# Patient Record
Sex: Female | Born: 1960
Health system: Southern US, Community
[De-identification: ages and names within clinical notes are randomized; demographics above are authoritative.]

## PROBLEM LIST (undated history)

## (undated) DIAGNOSIS — E785 Hyperlipidemia, unspecified: Secondary | ICD-10-CM

## (undated) DIAGNOSIS — I10 Essential (primary) hypertension: Secondary | ICD-10-CM

## (undated) DIAGNOSIS — G43909 Migraine, unspecified, not intractable, without status migrainosus: Secondary | ICD-10-CM

## (undated) DIAGNOSIS — M549 Dorsalgia, unspecified: Secondary | ICD-10-CM

## (undated) DIAGNOSIS — I97190 Other postprocedural cardiac functional disturbances following cardiac surgery: Secondary | ICD-10-CM

## (undated) HISTORY — DX: Hyperlipidemia, unspecified: E78.5

## (undated) HISTORY — DX: Migraine, unspecified, not intractable, without status migrainosus: G43.909

## (undated) HISTORY — DX: Dorsalgia, unspecified: M54.9

## (undated) HISTORY — DX: Other postprocedural cardiac functional disturbances following cardiac surgery: I97.190

## (undated) HISTORY — DX: Essential (primary) hypertension: I10

## (undated) HISTORY — PX: ADENOIDECTOMY: SUR15

---
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
## 1997-12-23 HISTORY — PX: BREAST BIOPSY: SHX20

## 2003-05-26 HISTORY — PX: CHOLECYSTECTOMY: SHX55

## 2007-08-26 HISTORY — PX: BICEPS TENDON REPAIR: SHX566

## 2012-03-08 LAB — HM COLONOSCOPY

## 2013-10-18 ENCOUNTER — Ambulatory Visit: Payer: Managed Care, Other (non HMO) | Admitting: Internal Medicine

## 2013-10-18 DIAGNOSIS — Z0289 Encounter for other administrative examinations: Secondary | ICD-10-CM

## 2013-11-15 ENCOUNTER — Ambulatory Visit (INDEPENDENT_AMBULATORY_CARE_PROVIDER_SITE_OTHER): Payer: Managed Care, Other (non HMO) | Admitting: Family Medicine

## 2013-11-15 ENCOUNTER — Encounter: Payer: Self-pay | Admitting: Family Medicine

## 2013-11-15 VITALS — BP 125/73 | HR 81 | Ht 64.0 in | Wt 151.0 lb

## 2013-11-15 DIAGNOSIS — I498 Other specified cardiac arrhythmias: Secondary | ICD-10-CM

## 2013-11-15 DIAGNOSIS — Z299 Encounter for prophylactic measures, unspecified: Secondary | ICD-10-CM

## 2013-11-15 DIAGNOSIS — H9190 Unspecified hearing loss, unspecified ear: Secondary | ICD-10-CM

## 2013-11-15 DIAGNOSIS — G43909 Migraine, unspecified, not intractable, without status migrainosus: Secondary | ICD-10-CM

## 2013-11-15 DIAGNOSIS — Z79899 Other long term (current) drug therapy: Secondary | ICD-10-CM

## 2013-11-15 DIAGNOSIS — Z5181 Encounter for therapeutic drug level monitoring: Secondary | ICD-10-CM

## 2013-11-15 DIAGNOSIS — I10 Essential (primary) hypertension: Secondary | ICD-10-CM

## 2013-11-15 DIAGNOSIS — R6889 Other general symptoms and signs: Secondary | ICD-10-CM | POA: Insufficient documentation

## 2013-11-15 DIAGNOSIS — E785 Hyperlipidemia, unspecified: Secondary | ICD-10-CM

## 2013-11-15 LAB — T4, FREE: Free T4: 1.19 ng/dL (ref 0.80–1.80)

## 2013-11-15 LAB — COMPLETE METABOLIC PANEL WITH GFR
ALK PHOS: 121 U/L — AB (ref 39–117)
ALT: 13 U/L (ref 0–35)
AST: 14 U/L (ref 0–37)
Albumin: 4.2 g/dL (ref 3.5–5.2)
BUN: 18 mg/dL (ref 6–23)
CALCIUM: 10 mg/dL (ref 8.4–10.5)
CO2: 27 mEq/L (ref 19–32)
CREATININE: 0.63 mg/dL (ref 0.50–1.10)
Chloride: 104 mEq/L (ref 96–112)
GFR, Est African American: >89 mL/min
GFR, Est Non African American: >89 mL/min
Glucose, Bld: 92 mg/dL (ref 70–99)
Potassium: 4.5 mEq/L (ref 3.5–5.3)
Sodium: 141 mEq/L (ref 135–145)
Total Bilirubin: 0.8 mg/dL (ref 0.2–1.2)
Total Protein: 6.7 g/dL (ref 6.0–8.3)

## 2013-11-15 LAB — LIPID PANEL
CHOL/HDL RATIO: 3.3 ratio
CHOLESTEROL: 175 mg/dL (ref 0–200)
HDL: 53 mg/dL (ref >39)
LDL Cholesterol: 102 mg/dL — ABNORMAL HIGH (ref 0–99)
TRIGLYCERIDES: 100 mg/dL (ref <150)
VLDL: 20 mg/dL (ref 0–40)

## 2013-11-15 LAB — TSH: TSH: 1.146 u[IU]/mL (ref 0.350–4.500)

## 2013-11-15 LAB — T3, FREE: T3 FREE: 3.4 pg/mL (ref 2.3–4.2)

## 2013-11-15 MED ORDER — AMLODIPINE BESY-BENAZEPRIL HCL 5-10 MG PO CAPS: 1.0000 | ORAL_CAPSULE | Freq: Every day | ORAL | Status: DC

## 2013-11-15 MED ORDER — METOPROLOL SUCCINATE ER 50 MG PO TB24: 50.0000 mg | ORAL_TABLET | Freq: Every day | ORAL | Status: DC

## 2013-11-15 MED ORDER — PRAVASTATIN SODIUM 10 MG PO TABS: 10.0000 mg | ORAL_TABLET | Freq: Every day | ORAL | Status: DC

## 2013-11-15 MED ORDER — GABAPENTIN 300 MG PO CAPS: 300.0000 mg | ORAL_CAPSULE | Freq: Three times a day (TID) | ORAL | Status: DC

## 2013-11-15 NOTE — Progress Notes (Signed)
CC: Rebecca Berry is a 53 y.o. female is here for Establish Care   Subjective: HPI:   Complains of fatigue and unintentional weight gain has been present for the past year she estimates 30 pounds of weight gain despite exercising most days of the week and cutting out simple sugars in her diet. This is not accompanied by a bloating sensation in her abdomen that is diffuse mild in severity nothing particularly makes better or worse. No interventions as of yet for the above symptoms. They're present on a daily basis.  Weight gain has been gradual.  Complains of chronic hearing loss due to what she describes as stapes deterioration that began years ago. Hearing loss does not interfere with quality of life has not been subjectively declining over the past 2 years since she had an audiology evaluation last. When she last saw the nares and throat they recommended audiology evaluation every 2 years.   She reports a history of intermittent rectal bleeding but has been present ever since the birth of her first child. She colonoscopy in 2013 for these symptoms which was unremarkable other than hemorrhoids. Bleeding seems to be worse during periods of constipation when she's missed a day of defecating or when she strains to defecate. Denies melena  Reports a history of hypertension unknown date at original diagnosis but has been taking metoprolol and Lotrel daily without missed doses. She has a blood pressure diary today spanning back about a month all blood pressures are well within the normotensive range. She was no she can stop any of her medications.  Reports a history of migraines that she currently takes gabapentin for on a daily basis. She started this about a year ago and ever since then has somewhere around 1-2 migraines a year that are resolved with Imitrex as needed.  Reports a history of hyperlipidemia currently taking pravastatin on a daily basis it's been a year since her last cholesterol  panel was checked. Denies right upper quadrant pain nor myalgias  Review of Systems - General ROS: negative for - chills, fever, night sweats, or weight loss Ophthalmic ROS: negative for - decreased vision Psychological ROS: negative for - anxiety or depression ENT ROS: negative for - hearing change, nasal congestion, tinnitus or allergies Hematological and Lymphatic ROS: negative for - bleeding problems, bruising or swollen lymph nodes Breast ROS: negative Respiratory ROS: no cough, shortness of breath, or wheezing Cardiovascular ROS: no chest pain or dyspnea on exertion Gastrointestinal ROS: no abdominal pain, change in bowel habits, or black stools Genito-Urinary ROS: negative for - genital discharge, genital ulcers, incontinence or abnormal bleeding from genitals Musculoskeletal ROS: negative for - joint pain or muscle pain Neurological ROS: negative for - headaches or memory loss Dermatological ROS: negative for lumps, mole changes, rash and skin lesion changes  Past Medical History  Diagnosis Date  . Pacemaker syndrome   . Hypertension   . Hyperlipidemia     Past Surgical History  Procedure Laterality Date  . Tonsillectomy and adenoidectomy  1962  . Breast biopsy  12/1997  . Cholecystectomy  05/2003  . Biceps tendon repair  08/2007   Family History  Problem Relation Age of Onset  . Alcoholism Father   . Bladder Cancer Father   . Lung cancer Mother   . Heart attack Father   . Hypertension Father   . Leukemia Paternal Grandmother     History   Social History  . Marital Status: Married    Spouse Name: N/A  Number of Children: N/A  . Years of Education: N/A   Occupational History  . Not on file.   Social History Main Topics  . Smoking status: Former Smoker    Quit date: 03/26/2012  . Smokeless tobacco: Not on file  . Alcohol Use: No  . Drug Use: No  . Sexual Activity: Yes    Partners: Male   Other Topics Concern  . Not on file   Social History Narrative   . No narrative on file     Objective: BP 125/73  Pulse 81  Ht 5\' 4"  (1.626 m)  Wt 151 lb (68.493 kg)  BMI 25.91 kg/m2  General: Alert and Oriented, No Acute Distress HEENT: Pupils equal, round, reactive to light. Conjunctivae clear.  External ears unremarkable, canals clear with intact TMs with appropriate landmarks.  Middle ear appears open without effusion. Pink inferior turbinates.  Moist mucous membranes, pharynx without inflammation nor lesions.  Neck supple without palpable lymphadenopathy nor abnormal masses. Lungs: Clear to auscultation bilaterally, no wheezing/ronchi/rales.  Comfortable work of breathing. Good air movement. Cardiac: Regular rate and rhythm. Normal S1/S2.  No murmurs, rubs, nor gallops.   Extremities: No peripheral edema.  Strong peripheral pulses.  Mental Status: No depression, anxiety, nor agitation. Skin: Warm and dry.  Assessment & Plan: Rebecca Berry was seen today for establish care.  Diagnoses and associated orders for this visit:  Essential hypertension, benign - COMPLETE METABOLIC PANEL WITH GFR  Hyperlipidemia - Lipid panel  Encounter for monitoring statin therapy - COMPLETE METABOLIC PANEL WITH GFR  Wandering pacemaker  Migraine  Preventive measure  Unintentional weight change - TSH - T3, free - T4, free  Hearing loss - Ambulatory referral to Audiology  Other Orders - amLODipine-benazepril (LOTREL) 5-10 MG per capsule; Take 1 capsule by mouth daily. - gabapentin (NEURONTIN) 300 MG capsule; Take 1 capsule (300 mg total) by mouth 3 (three) times daily. - metoprolol succinate (TOPROL-XL) 50 MG 24 hr tablet; Take 1 tablet (50 mg total) by mouth daily. Take with or immediately following a meal. - pravastatin (PRAVACHOL) 10 MG tablet; Take 1 tablet (10 mg total) by mouth daily.    Essential hypertension: Controlled advised her not to stop Lotrel completely but she can decrease dose by half he will pressure log and present in the next  2-4 weeks Hyperlipidemia: Due for annual lipid panel along with liver function tests Migraines: Controlled continue gabapentin and as needed Imitrex Unintentional weight gain: Checking thyroid function especially given her fatigue and bloating Hearing loss: Referral to cardiology for twice a year evaluations Wandering pacemaker: Will obtain EKG upcoming complete physical exam  45 minutes spent face-to-face during visit today of which at least 50% was counseling or coordinating care regarding: 1. Essential hypertension, benign   2. Hyperlipidemia   3. Encounter for monitoring statin therapy   4. Wandering pacemaker   5. Migraine   6. Preventive measure   7. Unintentional weight change   8. Hearing loss      Return in about 3 months (around 02/15/2014).

## 2013-11-16 ENCOUNTER — Encounter: Payer: Self-pay | Admitting: Family Medicine

## 2013-11-16 DIAGNOSIS — R5383 Other fatigue: Secondary | ICD-10-CM | POA: Insufficient documentation

## 2013-11-21 ENCOUNTER — Encounter: Payer: Managed Care, Other (non HMO) | Admitting: Family Medicine

## 2013-11-23 ENCOUNTER — Encounter: Payer: Self-pay | Admitting: Family Medicine

## 2013-11-23 ENCOUNTER — Ambulatory Visit (INDEPENDENT_AMBULATORY_CARE_PROVIDER_SITE_OTHER): Payer: Managed Care, Other (non HMO) | Admitting: Family Medicine

## 2013-11-23 VITALS — BP 111/68 | HR 70 | Ht 64.0 in | Wt 151.0 lb

## 2013-11-23 DIAGNOSIS — I498 Other specified cardiac arrhythmias: Secondary | ICD-10-CM

## 2013-11-23 DIAGNOSIS — Z1239 Encounter for other screening for malignant neoplasm of breast: Secondary | ICD-10-CM

## 2013-11-23 DIAGNOSIS — Z0000 Encounter for general adult medical examination without abnormal findings: Secondary | ICD-10-CM

## 2013-11-23 NOTE — Progress Notes (Signed)
CC: Rebecca Berry is a 53 y.o. female is here for Annual Exam   Subjective: HPI:  Colonoscopy: Colonoscopy in 2013 for bleeding which was unremarkable other than hemorrhoids Papsmear: Normal July 2013 Mammogram:  [facility] 1/24 February 2012, will order repeat for this spring.  Influenza Vaccine: 07/11/2013 Pneumovax: no current indication Td/Tdap:  05/2013 Zoster: (Start 54 yo)  Social alcohol use, no tobacco or recreational drug use  Goes to gym most days of the week, is very particular about sticking to a low fat diet and avoiding unnecessary sweets.  Review of Systems - General ROS: negative for - chills, fever, night sweats, weight gain or weight loss Ophthalmic ROS: negative for - decreased vision Psychological ROS: negative for - anxiety or depression ENT ROS: negative for - hearing change, nasal congestion, tinnitus or allergies Hematological and Lymphatic ROS: negative for - bleeding problems, bruising or swollen lymph nodes Breast ROS: negative Respiratory ROS: no cough, shortness of breath, or wheezing Cardiovascular ROS: no chest pain or dyspnea on exertion Gastrointestinal ROS: no abdominal pain, change in bowel habits, or black or bloody stools Genito-Urinary ROS: negative for - genital discharge, genital ulcers, incontinence or abnormal bleeding from genitals Musculoskeletal ROS: negative for - joint pain or muscle pain Neurological ROS: negative for - headaches or memory loss Dermatological ROS: negative for lumps, mole changes, rash and skin lesion changes  Past Medical History  Diagnosis Date  . Pacemaker syndrome   . Hypertension   . Hyperlipidemia     Past Surgical History  Procedure Laterality Date  . Tonsillectomy and adenoidectomy  1962  . Breast biopsy  12/1997  . Cholecystectomy  05/2003  . Biceps tendon repair  08/2007   Family History  Problem Relation Age of Onset  . Alcoholism Father   . Bladder Cancer Father   . Lung cancer Mother    . Heart attack Father   . Hypertension Father   . Leukemia Paternal Grandmother     History   Social History  . Marital Status: Married    Spouse Name: N/A    Number of Children: N/A  . Years of Education: N/A   Occupational History  . Not on file.   Social History Main Topics  . Smoking status: Former Smoker    Quit date: 03/26/2012  . Smokeless tobacco: Not on file  . Alcohol Use: No  . Drug Use: No  . Sexual Activity: Yes    Partners: Male   Other Topics Concern  . Not on file   Social History Narrative  . No narrative on file     Objective: BP 111/68  Pulse 70  Ht 5\' 4"  (1.626 m)  Wt 151 lb (68.493 kg)  BMI 25.91 kg/m2  General: No Acute Distress HEENT: Atraumatic, normocephalic, conjunctivae normal without scleral icterus.  No nasal discharge, hearing grossly intact, TMs with good landmarks bilaterally with no middle ear abnormalities, posterior pharynx clear without oral lesions. Neck: Supple, trachea midline, no cervical nor supraclavicular adenopathy. Pulmonary: Clear to auscultation bilaterally without wheezing, rhonchi, nor rales. Cardiac: Regular rate and rhythm.  No murmurs, rubs, nor gallops. No peripheral edema.  2+ peripheral pulses bilaterally. Abdomen: Bowel sounds normal.  No masses.  Non-tender without rebound.  Negative Murphy's sign. GU: Deferred MSK: Grossly intact, no signs of weakness.  Full strength throughout upper and lower extremities.  Full ROM in upper and lower extremities.  No midline spinal tenderness. Neuro: Gait unremarkable, CN II-XII grossly intact.  C5-C6 Reflex 2/4 Bilaterally,  L4 Reflex 2/4 Bilaterally.  Cerebellar function intact. Skin: No rashes. Psych: Alert and oriented to person/place/time.  Thought process normal. No anxiety/depression.   Assessment & Plan: Rebecca LeatherwoodKatherine was seen today for annual exam.  Diagnoses and associated orders for this visit:  Annual physical exam  Wandering pacemaker - PR  ELECTROCARDIOGRAM, COMPLETE  Screening for breast cancer - MM DIGITAL SCREENING BILATERAL; Future    Healthy lifestyle interventions including but not limited to regular exercise, a healthy low fat diet, moderation of salt intake, the dangers of tobacco/alcohol/recreational drug use, nutrition supplementation, and accident avoidance were discussed with the patient and a handout was provided for future reference. Given her history of wandering pacemaker EKG was obtained showing normal sinus rhythm with normal axis and normal intervals. There were no pathologic Q waves nor ST segment elevation or depression.   Return in about 3 months (around 02/22/2014).

## 2013-11-23 NOTE — Patient Instructions (Signed)
Dr. Orin Eberwein's General Advice Following Your Complete Physical Exam  The Benefits of Regular Exercise: Unless you suffer from an uncontrolled cardiovascular condition, studies strongly suggest that regular exercise and physical activity will add to both the quality and length of your life.  The World Health Organization recommends 150 minutes of moderate intensity aerobic activity every week.  This is best split over 3-4 days a week, and can be as simple as a brisk walk for just over 35 minutes "most days of the week".  This type of exercise has been shown to lower LDL-Cholesterol, lower average blood sugars, lower blood pressure, lower cardiovascular disease risk, improve memory, and increase one's overall sense of wellbeing.  The addition of anaerobic (or "strength training") exercises offers additional benefits including but not limited to increased metabolism, prevention of osteoporosis, and improved overall cholesterol levels.  How Can I Strive For A Low-Fat Diet?: Current guidelines recommend that 25-35 percent of your daily energy (food) intake should come from fats.  One might ask how can this be achieved without having to dissect each meal on a daily basis?  Switch to skim or 1% milk instead of whole milk.  Focus on lean meats such as ground turkey, fresh fish, baked chicken, and lean cuts of beef as your source of dietary protein.  Limit saturated fat consumption to less than 10% of your daily caloric intake.  Limit trans fatty acid consumption primarily by limiting synthetic trans fats such as partially hydrogenated oils (Ex: fried fast foods).  Substitute olive or vegetable oil for solid fats where possible.  Moderation of Salt Intake: Provided you don't carry a diagnosis of congestive heart failure nor renal failure, I recommend a daily allowance of no more than 2300 mg of salt (sodium).  Keeping under this daily goal is associated with a decreased risk of cardiovascular events, creeping  above it can lead to elevated blood pressures and increases your risk of cardiovascular events.  Milligrams (mg) of salt is listed on all nutrition labels, and your daily intake can add up faster than you think.  Most canned and frozen dinners can pack in over half your daily salt allowance in one meal.    Lifestyle Health Risks: Certain lifestyle choices carry specific health risks.  As you may already know, tobacco use has been associated with increasing one's risk of cardiovascular disease, pulmonary disease, numerous cancers, among many other issues.  What you may not know is that there are medications and nicotine replacement strategies that can more than double your chances of successfully quitting.  I would be thrilled to help manage your quitting strategy if you currently use tobacco products.  When it comes to alcohol use, I've yet to find an "ideal" daily allowance.  Provided an individual does not have a medical condition that is exacerbated by alcohol consumption, general guidelines determine "safe drinking" as no more than two standard drinks for a man or no more than one standard drink for a female per day.  However, much debate still exists on whether any amount of alcohol consumption is technically "safe".  My general advice, keep alcohol consumption to a minimum for general health promotion.  If you or others believe that alcohol, tobacco, or recreational drug use is interfering with your life, I would be happy to provide confidential counseling regarding treatment options.  General "Over The Counter" Nutrition Advice: Postmenopausal women should aim for a daily calcium intake of 1200 mg, however a significant portion of this might already be   provided by diets including milk, yogurt, cheese, and other dairy products.  Vitamin D has been shown to help preserve bone density, prevent fatigue, and has even been shown to help reduce falls in the elderly.  Ensuring a daily intake of 800 Units of  Vitamin D is a good place to start to enjoy the above benefits, we can easily check your Vitamin D level to see if you'd potentially benefit from supplementation beyond 800 Units a day.  Folic Acid intake should be of particular concern to women of childbearing age.  Daily consumption of 400-800 mcg of Folic Acid is recommended to minimize the chance of spinal cord defects in a fetus should pregnancy occur.    For many adults, accidents still remain one of the most common culprits when it comes to cause of death.  Some of the simplest but most effective preventitive habits you can adopt include regular seatbelt use, proper helmet use, securing firearms, and regularly testing your smoke and carbon monoxide detectors.  Rebecca Dillinger B. Emmitt Matthews DO [facility] [address], [address] [place], [place] [address] Phone: [phone]  

## 2013-11-24 ENCOUNTER — Encounter: Payer: Self-pay | Admitting: *Deleted

## 2014-02-27 ENCOUNTER — Encounter: Payer: Self-pay | Admitting: Family Medicine

## 2014-02-27 ENCOUNTER — Ambulatory Visit (INDEPENDENT_AMBULATORY_CARE_PROVIDER_SITE_OTHER): Payer: Managed Care, Other (non HMO) | Admitting: Family Medicine

## 2014-02-27 VITALS — BP 127/73 | HR 82 | Wt 160.0 lb

## 2014-02-27 DIAGNOSIS — I10 Essential (primary) hypertension: Secondary | ICD-10-CM

## 2014-02-27 DIAGNOSIS — M722 Plantar fascial fibromatosis: Secondary | ICD-10-CM

## 2014-02-27 DIAGNOSIS — D824 Hyperimmunoglobulin E [IgE] syndrome: Secondary | ICD-10-CM

## 2014-02-27 DIAGNOSIS — D8989 Other specified disorders involving the immune mechanism, not elsewhere classified: Secondary | ICD-10-CM

## 2014-02-27 MED ORDER — CHLORHEXIDINE GLUCONATE 4 % EX SOLN: CUTANEOUS | Status: DC

## 2014-02-27 NOTE — Progress Notes (Signed)
CC: Rebecca FifeKatherine Berry is a 53 y.o. female is here for Hypertension   Subjective: HPI:  Followup essential hypertension: Continues on metoprolol and a decreased dose of Lotrel since I saw her last. She brings in a list of blood pressure she's taken over the past week 90% of which are normotensive with 10% of them being in pre-hypertensive range. She is exercising by walking 2 days a week. She denies chest pain, shortness of breath, orthopnea, peripheral edema nor motor or sensory disturbances  Patient states that she has a history of recurrent staphylococcal abscesses mostly under the arms and under the breasts. She wants to know if there's anything she can do to help prevent this in the future. She denies any current skin changes or skin pain.  Complains of left heel pain localized in the bottom of the foot it is nonradiating. Worse with wearing flip-flops or running. Symptoms improved with heel cups and stretching her Achilles tendon. Symptoms are present on a daily basis mild to moderate severity and have been present for at least some degree for matter of months. Denies overlying skin changes or weakness. Pain is only present when bearing weight.   Review Of Systems Outlined In HPI  Past Medical History  Diagnosis Date  . Pacemaker syndrome   . Hypertension   . Hyperlipidemia     Past Surgical History  Procedure Laterality Date  . Tonsillectomy and adenoidectomy  1962  . Breast biopsy  12/1997  . Cholecystectomy  05/2003  . Biceps tendon repair  08/2007   Family History  Problem Relation Age of Onset  . Alcoholism Father   . Bladder Cancer Father   . Lung cancer Mother   . Heart attack Father   . Hypertension Father   . Leukemia Paternal Grandmother     History   Social History  . Marital Status: Married    Spouse Name: N/A    Number of Children: N/A  . Years of Education: N/A   Occupational History  . Not on file.   Social History Main Topics  . Smoking status:  Former Smoker    Quit date: 03/26/2012  . Smokeless tobacco: Not on file  . Alcohol Use: No  . Drug Use: No  . Sexual Activity: Yes    Partners: Male   Other Topics Concern  . Not on file   Social History Narrative  . No narrative on file     Objective: BP 127/73  Pulse 82  Wt 160 lb (72.576 kg)  General: Alert and Oriented, No Acute Distress HEENT: Pupils equal, round, reactive to light. Conjunctivae clear.  Moist mucous membranes pharynx unremarkable Lungs: Clear to auscultation bilaterally, no wheezing/ronchi/rales.  Comfortable work of breathing. Good air movement. Cardiac: Regular rate and rhythm. Normal S1/S2.  No murmurs, rubs, nor gallops.   Extremities: No peripheral edema.  Strong peripheral pulses. Point tenderness on the plantar surface of the calcaneus with direct palpation without overlying skin changes Mental Status: No depression, anxiety, nor agitation. Skin: Warm and dry.  Assessment & Plan: Rebecca Berry was seen today for hypertension.  Diagnoses and associated orders for this visit:  Essential hypertension, benign  Recurring cold staphylococcal abscesses - Chlorhexidine Gluconate 4 % SOLN; Use as body soap daily as needed to prevent/treat staph infections.  Plantar fasciitis, left    Essential hypertension: Controlled continue current antihypertensive regimen Recurrent staph infections: Discuss using chlorhexidine as a body wash to help lower staphylococcal load on the skin. Plantar fasciitis: Given a handout  on home rehabilitation exercises and stretches to be done on a daily basis the next 3-4 weeks  25 minutes spent face-to-face during visit today of which at least 50% was counseling or coordinating care regarding: 1. Essential hypertension, benign   2. Recurring cold staphylococcal abscesses   3. Plantar fasciitis, left      Return in about 6 months (around 08/30/2014) for BP Follow Up.

## 2014-03-09 ENCOUNTER — Ambulatory Visit (INDEPENDENT_AMBULATORY_CARE_PROVIDER_SITE_OTHER): Payer: Managed Care, Other (non HMO) | Admitting: Physician Assistant

## 2014-03-09 ENCOUNTER — Telehealth: Payer: Self-pay | Admitting: *Deleted

## 2014-03-09 ENCOUNTER — Ambulatory Visit (INDEPENDENT_AMBULATORY_CARE_PROVIDER_SITE_OTHER): Payer: Managed Care, Other (non HMO)

## 2014-03-09 ENCOUNTER — Encounter: Payer: Self-pay | Admitting: Physician Assistant

## 2014-03-09 VITALS — BP 120/70 | HR 67 | Ht 64.0 in | Wt 155.0 lb

## 2014-03-09 DIAGNOSIS — R1030 Lower abdominal pain, unspecified: Secondary | ICD-10-CM

## 2014-03-09 DIAGNOSIS — R198 Other specified symptoms and signs involving the digestive system and abdomen: Secondary | ICD-10-CM

## 2014-03-09 DIAGNOSIS — K7689 Other specified diseases of liver: Secondary | ICD-10-CM

## 2014-03-09 DIAGNOSIS — R350 Frequency of micturition: Secondary | ICD-10-CM

## 2014-03-09 DIAGNOSIS — K449 Diaphragmatic hernia without obstruction or gangrene: Secondary | ICD-10-CM

## 2014-03-09 DIAGNOSIS — R109 Unspecified abdominal pain: Secondary | ICD-10-CM

## 2014-03-09 LAB — POCT URINALYSIS DIPSTICK
Bilirubin, UA: NEGATIVE
Blood, UA: NEGATIVE
Glucose, UA: NEGATIVE
KETONES UA: NEGATIVE
LEUKOCYTES UA: NEGATIVE
Nitrite, UA: NEGATIVE
PH UA: 6
PROTEIN UA: NEGATIVE
Urobilinogen, UA: 0.2

## 2014-03-09 LAB — CBC WITH DIFFERENTIAL/PLATELET
BASOS ABS: 0 10*3/uL (ref 0.0–0.1)
BASOS PCT: 0 % (ref 0–1)
Eosinophils Absolute: 0.2 10*3/uL (ref 0.0–0.7)
Eosinophils Relative: 2 % (ref 0–5)
HEMATOCRIT: 38.6 % (ref 36.0–46.0)
HEMOGLOBIN: 13.1 g/dL (ref 12.0–15.0)
LYMPHS PCT: 27 % (ref 12–46)
Lymphs Abs: 2.5 10*3/uL (ref 0.7–4.0)
MCH: 28.6 pg (ref 26.0–34.0)
MCHC: 33.9 g/dL (ref 30.0–36.0)
MCV: 84.3 fL (ref 78.0–100.0)
MONO ABS: 0.7 10*3/uL (ref 0.1–1.0)
MONOS PCT: 8 % (ref 3–12)
NEUTROS PCT: 63 % (ref 43–77)
Neutro Abs: 5.7 10*3/uL (ref 1.7–7.7)
Platelets: 324 10*3/uL (ref 150–400)
RBC: 4.58 MIL/uL (ref 3.87–5.11)
RDW: 13.5 % (ref 11.5–15.5)
WBC: 9.1 10*3/uL (ref 4.0–10.5)

## 2014-03-09 MED ORDER — TRAMADOL HCL 50 MG PO TABS
ORAL_TABLET | ORAL | Status: DC
Start: 2014-03-09 — End: 2014-05-30

## 2014-03-09 MED ORDER — IOHEXOL 300 MG/ML  SOLN
100.0000 mL | Freq: Once | INTRAMUSCULAR | Status: AC | PRN
  Administered 2014-03-09: 100 mL via INTRAVENOUS

## 2014-03-09 NOTE — Progress Notes (Signed)
Subjective:    Patient ID: Rebecca Berry, female    DOB: 07/28/1961, 53 y.o.   MRN: [id number]  HPI Pt is a 53 yo female who presents to the clinic with bilateral lower abdominal pain. It started a week ago after doing a lot of sit ups. She blew if off to being sore. Discomfort was stable until Tuesday night of this week about 3 days ago. Wednesday morning she couldn't get out of bed because moving any muscles in her abdomen were painful. She actually woke up in pain and could not sleep last night due to pain. Describes the pain as constant and achy worse with movement. She does complain of some lower back pain. Ibuprofen lessens pain but does not take away. No other know trauma other than situps but she works out on a regular basis. No urinary pain or pressure she has had some urinary frequency increase. No fever, chills, nausea or vomiting. No bowel changes.  Food does not make better or worse. Gallbladder has been surgically removed.    Review of Systems  All other systems reviewed and are negative.      Objective:   Physical Exam  Constitutional: She is oriented to person, place, and time. She appears well-developed and well-nourished.  HENT:  Head: Normocephalic and atraumatic.  Cardiovascular: Normal rate, regular rhythm and normal heart sounds.   Pulmonary/Chest: Effort normal and breath sounds normal.  No CVA tenderness.   Abdominal: Soft. Bowel sounds are normal. She exhibits no distension.  Tenderness and guarding over bilateral lower abdominal quadrant to palpation.   Neurological: She is alert and oriented to person, place, and time.  Skin: Skin is dry. She is not diaphoretic.  Psychiatric: She has a normal mood and affect. Her behavior is normal.          Assessment & Plan:  Bilateral lower abdominal pain and guarding- no physical symptoms concerning except for guarding over lower quadrant to palpitation. Will get stat CT and CBC. .. Results for orders placed in  visit on 03/09/14  CBC WITH DIFFERENTIAL      Result Value Ref Range   WBC 9.1  4.0 - 10.5 K/uL   RBC 4.58  3.87 - 5.11 MIL/uL   Hemoglobin 13.1  12.0 - 15.0 g/dL   HCT 38.6  36.0 - 46.0 %   MCV 84.3  78.0 - 100.0 fL   MCH 28.6  26.0 - 34.0 pg   MCHC 33.9  30.0 - 36.0 g/dL   RDW 13.5  11.5 - 15.5 %   Platelets 324  150 - 400 K/uL   Neutrophils Relative % 63  43 - 77 %   Neutro Abs 5.7  1.7 - 7.7 K/uL   Lymphocytes Relative 27  12 - 46 %   Lymphs Abs 2.5  0.7 - 4.0 K/uL   Monocytes Relative 8  3 - 12 %   Monocytes Absolute 0.7  0.1 - 1.0 K/uL   Eosinophils Relative 2  0 - 5 %   Eosinophils Absolute 0.2  0.0 - 0.7 K/uL   Basophils Relative 0  0 - 1 %   Basophils Absolute 0.0  0.0 - 0.1 K/uL   Smear Review Criteria for review not met    POCT URINALYSIS DIPSTICK      Result Value Ref Range   Color, UA yellow     Clarity, UA clear     Glucose, UA neg     Bilirubin, UA neg  Ketones, UA neg     Spec Grav, UA <=1.005     Blood, UA neg     pH, UA 6.0     Protein, UA neg     Urobilinogen, UA 0.2     Nitrite, UA neg     Leukocytes, UA Negative     Will culture urine. No other signs of UTI. Unclear etiology at this point where the pain is coming from would like to rule out diverticulitis, appendicitis, ovarian cyst. Certainly if all negative could consider pulled muscle from sit ups.    CT results: 1. No abnormalities identified to explain lower abdominal pain.  Specifically, no evidence of diverticulitis, appendicitis, or  ovarian cyst.  2. No acute abnormalities involving the abdomen or pelvis.  3. Mild diffuse hepatic steatosis.  4. Small hiatal hernia.  5. Moderate to severe aortoiliofemoral atherosclerosis, advanced for  age. Widely patent celiac and superior mesenteric arteries with  possible stenosis at the origin of the IMA, therefore, no evidence  of large vessel mesenteric ischemia.   Discussed findings with pt. Pt had 3 bowel movements after drinking contrast  could help stool burden. No clear cut explaination for pain could be abdominal muscle sprain. Warm compresses, ibuprofen regularly, sent some tramadol for acute pain(pt was not able to sleep last night), follow up with any new symptoms or worsening pain.

## 2014-03-09 NOTE — Telephone Encounter (Signed)
No prior auth required as per Gery PraySusan O. @ [facility] for [place] [id number]. Corliss SkainsJamie Hazelene Doten, CMA

## 2014-03-10 ENCOUNTER — Encounter: Payer: Self-pay | Admitting: Physician Assistant

## 2014-03-10 DIAGNOSIS — K449 Diaphragmatic hernia without obstruction or gangrene: Secondary | ICD-10-CM | POA: Insufficient documentation

## 2014-03-10 DIAGNOSIS — K760 Fatty (change of) liver, not elsewhere classified: Secondary | ICD-10-CM | POA: Insufficient documentation

## 2014-03-11 LAB — URINE CULTURE

## 2014-03-12 ENCOUNTER — Encounter: Payer: Self-pay | Admitting: Family Medicine

## 2014-03-12 DIAGNOSIS — I700 Atherosclerosis of aorta: Secondary | ICD-10-CM | POA: Insufficient documentation

## 2014-03-16 ENCOUNTER — Ambulatory Visit (INDEPENDENT_AMBULATORY_CARE_PROVIDER_SITE_OTHER): Payer: Managed Care, Other (non HMO) | Admitting: Family Medicine

## 2014-03-16 ENCOUNTER — Encounter: Payer: Self-pay | Admitting: Family Medicine

## 2014-03-16 VITALS — BP 120/78 | HR 66 | Ht 68.0 in | Wt 154.0 lb

## 2014-03-16 DIAGNOSIS — K7689 Other specified diseases of liver: Secondary | ICD-10-CM

## 2014-03-16 DIAGNOSIS — I700 Atherosclerosis of aorta: Secondary | ICD-10-CM

## 2014-03-16 DIAGNOSIS — E785 Hyperlipidemia, unspecified: Secondary | ICD-10-CM

## 2014-03-16 DIAGNOSIS — K760 Fatty (change of) liver, not elsewhere classified: Secondary | ICD-10-CM

## 2014-03-16 MED ORDER — ATORVASTATIN CALCIUM 40 MG PO TABS: 40.0000 mg | ORAL_TABLET | Freq: Every day | ORAL | Status: DC

## 2014-03-16 NOTE — Progress Notes (Signed)
CC: Rebecca Berry is a 53 y.o. female is here for Follow-up   Subjective: HPI:  Followup hyperlipidemia: On a recent CT scan she was incidentally found to have severe atherosclerosis for her age of the distal aorta. She is already taking a statin however it's a low-dose pravastatin. She reports a 20 year history of smoking but stopped 2 years ago. She started becoming exercise routine walking most days of the week and has never experienced exertional chest pain or limb claudication. She reports no history of renal compromise nor have I ever observed any for her. She reports a strong family history of heart disease.  CT scan also incidentally found mild hepatic steatosis which she tells me she is aware of that she was once told she had a fatty liver many years ago when her liver enzymes were elevated. She now drinks only one alcoholic drink a year and does not take acetaminophen. Liver enzymes were checked recently and were normal. She denies right upper quadrant pain and her abdominal pain that was present about a week ago has resolved after return of normal bowel habits.  She denies abdominal pain, back pain, flank pain, GI disturbance, nor any motor or sensory disturbances recently remotely   Review Of Systems Outlined In HPI  Past Medical History  Diagnosis Date  . Pacemaker syndrome   . Hypertension   . Hyperlipidemia     Past Surgical History  Procedure Laterality Date  . Tonsillectomy and adenoidectomy  1962  . Breast biopsy  12/1997  . Cholecystectomy  05/2003  . Biceps tendon repair  08/2007   Family History  Problem Relation Age of Onset  . Alcoholism Father   . Bladder Cancer Father   . Lung cancer Mother   . Heart attack Father   . Hypertension Father   . Leukemia Paternal Grandmother     History   Social History  . Marital Status: Married    Spouse Name: N/A    Number of Children: N/A  . Years of Education: N/A   Occupational History  . Not on file.    Social History Main Topics  . Smoking status: Former Smoker    Quit date: 03/26/2012  . Smokeless tobacco: Not on file  . Alcohol Use: No  . Drug Use: No  . Sexual Activity: Yes    Partners: Male   Other Topics Concern  . Not on file   Social History Narrative  . No narrative on file     Objective: BP 120/78  Pulse 66  Ht 5\' 8"  (1.727 m)  Wt 154 lb (69.854 kg)  BMI 23.42 kg/m2  General: Alert and Oriented, No Acute Distress HEENT: Pupils equal, round, reactive to light. Conjunctivae clear.  Moist mucous membranes pharynx unremarkable Lungs: Clear to auscultation bilaterally, no wheezing/ronchi/rales.  Comfortable work of breathing. Good air movement. Cardiac: Regular rate and rhythm. Normal S1/S2.  No murmurs, rubs, nor gallops.   Abdomen: Soft nontender Extremities: No peripheral edema.  Strong peripheral pulses.  Mental Status: No depression, anxiety, nor agitation. Skin: Warm and dry.  Assessment & Plan: [name] was seen today for follow-up.  Diagnoses and associated orders for this visit:  Atherosclerosis of aorta - atorvastatin (LIPITOR) 40 MG tablet; Take 1 tablet (40 mg total) by mouth daily.  Hyperlipidemia - atorvastatin (LIPITOR) 40 MG tablet; Take 1 tablet (40 mg total) by mouth daily.  Hepatic steatosis    Atherosclerosis of the aorta: Congratulated her smoking cessation and encouraged her to continue  with this for the rest of her life. Discussed with her and her husband I like them to be a little bit more aggressive about cholesterol control and to start on a more potent statin specifically Lipitor. She's currently taking naproxen once a day for plantar fasciitis.  When she gets the point were she is no longer taking this on a daily basis I like her to start on a baby aspirin until told to do otherwise. Encouraged her to engage in exercise most days of the week  Hepatic steatosis: Discussed avoidance of alcohol, the importance of exercise, and to  look for signs of right upper quadrant pain. No further intervention at this time we'll check liver enzymes periodically.  25 minutes spent face-to-face during visit today of which at least 50% was counseling or coordinating care regarding: 1. Atherosclerosis of aorta   2. Hyperlipidemia   3. Hepatic steatosis       Return in about 3 months (around 06/16/2014).

## 2014-05-02 ENCOUNTER — Encounter: Payer: Self-pay | Admitting: Emergency Medicine

## 2014-05-02 ENCOUNTER — Emergency Department (INDEPENDENT_AMBULATORY_CARE_PROVIDER_SITE_OTHER)
Admission: EM | Admit: 2014-05-02 | Discharge: 2014-05-02 | Disposition: A | Discharge status: Home / Self Care | Payer: Managed Care, Other (non HMO) | Source: Home / Self Care

## 2014-05-02 DIAGNOSIS — R0982 Postnasal drip: Secondary | ICD-10-CM

## 2014-05-02 DIAGNOSIS — J069 Acute upper respiratory infection, unspecified: Secondary | ICD-10-CM

## 2014-05-02 LAB — POCT RAPID STREP A (OFFICE): RAPID STREP A SCREEN: NEGATIVE

## 2014-05-02 MED ORDER — AMOXICILLIN 500 MG PO TABS: 500.0000 mg | ORAL_TABLET | Freq: Two times a day (BID) | ORAL | Status: DC

## 2014-05-02 MED ORDER — FLUTICASONE PROPIONATE 50 MCG/ACT NA SUSP: 2.0000 | Freq: Every day | NASAL | Status: DC

## 2014-05-02 NOTE — Discharge Instructions (Signed)
mucinex D twice daily for at least next 3 days.   Upper Respiratory Infection, Adult An upper respiratory infection (URI) is also known as the common cold. It is often caused by a type of germ (virus). Colds are easily spread (contagious). You can pass it to others by kissing, coughing, sneezing, or drinking out of the same glass. Usually, you get better in 1 or 2 weeks.  HOME CARE   Only take medicine as told by your doctor.  Use a warm mist humidifier or breathe in steam from a hot shower.  Drink enough water and fluids to keep your pee (urine) clear or pale yellow.  Get plenty of rest.  Return to work when your temperature is back to normal or as told by your doctor. You may use a face mask and wash your hands to stop your cold from spreading. GET HELP RIGHT AWAY IF:   After the first few days, you feel you are getting worse.  You have questions about your medicine.  You have chills, shortness of breath, or brown or red spit (mucus).  You have yellow or brown snot (nasal discharge) or pain in the face, especially when you bend forward.  You have a fever, puffy (swollen) neck, pain when you swallow, or white spots in the back of your throat.  You have a bad headache, ear pain, sinus pain, or chest pain.  You have a high-pitched whistling sound when you breathe in and out (wheezing).  You have a lasting cough or cough up blood.  You have sore muscles or a stiff neck. MAKE SURE YOU:   Understand these instructions.  Will watch your condition.  Will get help right away if you are not doing well or get worse. Document Released: 01/28/2008 Document Revised: 11/03/2011 Document Reviewed: 11/16/2013 [facility] Patient Information 2015 [place], [place]. This information is not intended to replace advice given to you by your health care provider. Make sure you discuss any questions you have with your health care provider.

## 2014-05-02 NOTE — ED Notes (Signed)
Pt c/o sore throat x 1 day with a productive cough x today. Denies fever.

## 2014-05-02 NOTE — ED Provider Notes (Signed)
CSN: [id number]     Arrival date & time 05/02/14  1478 History   None    Chief Complaint  Patient presents with  . Sore Throat  . Cough   (Consider location/radiation/quality/duration/timing/severity/associated sxs/prior Treatment) HPI Pt presents to the clinic with ST and cough since yesterday. 2 days ago pt went on 24 hour bike ride with her husband. When she woke up next morning had ST. No fever, chills, nausea or vomiting. ST is worst symptom. No wheezing or SOB. She does have a productive cough that started this am. Not taking anything to make better. Sputum is whitish/green. Starting to get some sinus pressure.   Past Medical History  Diagnosis Date  . Pacemaker syndrome   . Hypertension   . Hyperlipidemia    Past Surgical History  Procedure Laterality Date  . Tonsillectomy and adenoidectomy  1962  . Breast biopsy  12/1997  . Cholecystectomy  05/2003  . Biceps tendon repair  08/2007   Family History  Problem Relation Age of Onset  . Alcoholism Father   . Bladder Cancer Father   . Lung cancer Mother   . Heart attack Father   . Hypertension Father   . Leukemia Paternal Grandmother    History  Substance Use Topics  . Smoking status: Former Smoker    Quit date: 03/26/2012  . Smokeless tobacco: Not on file  . Alcohol Use: No   OB History   Grav Para Term Preterm Abortions TAB SAB Ect Mult Living                 Review of Systems  All other systems reviewed and are negative.   Allergies  Vibramycin  Home Medications   Prior to Admission medications   Medication Sig Start Date End Date Taking? Authorizing Provider  amLODipine-benazepril (LOTREL) 5-10 MG per capsule Take 1 capsule by mouth daily. 11/15/13   Sean Hommel, DO  amoxicillin (AMOXIL) 500 MG tablet Take 1 tablet (500 mg total) by mouth 2 (two) times daily. For 10 days. 05/02/14   Marquay Kruse L Tymel Conely, PA-C  Ascorbic Acid (VITAMIN C PO) Take by mouth.    Historical Provider, MD  aspirin 81 MG tablet Take 1  tablet (81 mg total) by mouth daily. When not taking daily NSAIDs 03/16/14   Sean Hommel, DO  atorvastatin (LIPITOR) 40 MG tablet Take 1 tablet (40 mg total) by mouth daily. 03/16/14   Laren Boom, DO  Chlorhexidine Gluconate 4 % SOLN Use as body soap daily as needed to prevent/treat staph infections. 02/27/14   Sean Hommel, DO  fluticasone (FLONASE) 50 MCG/ACT nasal spray Place 2 sprays into both nostrils daily. 05/02/14   Brantlee Penn L Korrin Waterfield, PA-C  gabapentin (NEURONTIN) 300 MG capsule Take 1 capsule (300 mg total) by mouth 3 (three) times daily. 11/15/13   Laren Boom, DO  metoprolol succinate (TOPROL-XL) 50 MG 24 hr tablet Take 1 tablet (50 mg total) by mouth daily. Take with or immediately following a meal. 11/15/13   Sean Hommel, DO  SUMAtriptan (IMITREX) 50 MG tablet Take 50 mg by mouth every 2 (two) hours as needed for migraine or headache. May repeat in 2 hours if headache persists or recurs.    Historical Provider, MD  traMADol (ULTRAM) 50 MG tablet 1-2 tabs by mouth Q8 hours, maximum 6 tabs per day. 03/09/14   Latica Hohmann L Suleman Gunning, PA-C   BP 117/68  Pulse 66  Temp(Src) 98.4 F (36.9 C) (Oral)  Resp 16  Ht  (1.626  m)  Wt 160 lb (72.576 kg)  BMI 27.45 kg/m2  SpO2 100% Physical Exam  Constitutional: She is oriented to person, place, and time. She appears well-developed and well-nourished.  HENT:  Head: Normocephalic and atraumatic.  Right TM bulging with fluid behind it.  PND present on erythematous oropharynx.  Nasal turbinates red and swollen bilaterally.   Eyes: Conjunctivae are normal. Right eye exhibits no discharge. Left eye exhibits no discharge.  Neck: Normal range of motion. Neck supple.  Cardiovascular: Normal rate, regular rhythm and normal heart sounds.   Pulmonary/Chest: Effort normal and breath sounds normal. She has no wheezes.  Lymphadenopathy:    She has no cervical adenopathy.  Neurological: She is alert and oriented to person, place, and time.  Skin: Skin is dry.   Psychiatric: She has a normal mood and affect. Her behavior is normal.    ED Course  Procedures (including critical care time) Labs Review Labs Reviewed  POCT RAPID STREP A (OFFICE)    Imaging Review No results found.   MDM   1. URI (upper respiratory infection)   2. Post-nasal drip    Discussed with patient I feel like symptoms today are viral/inflammatory.  Rapid strep negative.  Would like for pt to start mucinex D twice daily for 3-5 days.  Delsym for cough.  flonase for nasal congestion and ear pressure.  Ibuprofen for aches and pain.  If not improving after 3-5 days. Can start amoxil for 10 days.  Follow up as needed with PCP.     Jomarie Longs, PA-C 05/02/14 [phone]

## 2014-05-04 ENCOUNTER — Ambulatory Visit: Payer: Managed Care, Other (non HMO)

## 2014-05-08 NOTE — ED Provider Notes (Signed)
Agree with exam, assessment, and plan.   Lattie Haw, MD 05/08/14 1120

## 2014-05-18 ENCOUNTER — Ambulatory Visit (INDEPENDENT_AMBULATORY_CARE_PROVIDER_SITE_OTHER): Payer: Managed Care, Other (non HMO)

## 2014-05-18 DIAGNOSIS — Z1231 Encounter for screening mammogram for malignant neoplasm of breast: Secondary | ICD-10-CM

## 2014-05-18 DIAGNOSIS — Z1239 Encounter for other screening for malignant neoplasm of breast: Secondary | ICD-10-CM

## 2014-05-30 ENCOUNTER — Ambulatory Visit (INDEPENDENT_AMBULATORY_CARE_PROVIDER_SITE_OTHER): Payer: Managed Care, Other (non HMO) | Admitting: Family Medicine

## 2014-05-30 ENCOUNTER — Encounter: Payer: Self-pay | Admitting: Family Medicine

## 2014-05-30 VITALS — BP 121/68 | HR 69 | Wt 146.0 lb

## 2014-05-30 DIAGNOSIS — K760 Fatty (change of) liver, not elsewhere classified: Secondary | ICD-10-CM

## 2014-05-30 DIAGNOSIS — I10 Essential (primary) hypertension: Secondary | ICD-10-CM

## 2014-05-30 DIAGNOSIS — E785 Hyperlipidemia, unspecified: Secondary | ICD-10-CM

## 2014-05-30 MED ORDER — GABAPENTIN 300 MG PO CAPS: 300.0000 mg | ORAL_CAPSULE | Freq: Three times a day (TID) | ORAL | Status: DC

## 2014-05-30 MED ORDER — METOPROLOL SUCCINATE ER 50 MG PO TB24
50.0000 mg | ORAL_TABLET | Freq: Every day | ORAL | Status: DC
Start: 2014-05-30 — End: 2014-06-12

## 2014-05-30 MED ORDER — AMLODIPINE BESY-BENAZEPRIL HCL 5-10 MG PO CAPS: 1.0000 | ORAL_CAPSULE | Freq: Every day | ORAL | Status: DC

## 2014-05-30 NOTE — Progress Notes (Signed)
CC: Rebecca Berry is a 53 y.o. female is here for Hypertension   Subjective: HPI:  Followup essential hypertension: Continues to take metoprolol and Lotrel a daily basis with no outside blood pressures to report. She's exercising most days of the week with walking, going to the gym, she's lost 10 pounds over the last 2 months intentionally by cutting back on processed foods. There's been no chest pain shortness of breath orthopnea nor peripheral edema   Followup hepatic steatosis: She stopped taking alcohol since I saw her last and denies any right upper quadrant pain.  Followup hyperlipidemia: Since I saw her last she began taking a 40 mg of Lipitor without right upper quadrant pain or myalgias. Denies limb claudication and has cut back on fats and cholesterol in her diet.    Review Of Systems Outlined In HPI  Past Medical History  Diagnosis Date  . Pacemaker syndrome   . Hypertension   . Hyperlipidemia     Past Surgical History  Procedure Laterality Date  . Tonsillectomy and adenoidectomy  1962  . Breast biopsy  12/1997  . Cholecystectomy  05/2003  . Biceps tendon repair  08/2007   Family History  Problem Relation Age of Onset  . Alcoholism Father   . Bladder Cancer Father   . Lung cancer Mother   . Heart attack Father   . Hypertension Father   . Leukemia Paternal Grandmother     History   Social History  . Marital Status: Married    Spouse Name: N/A    Number of Children: N/A  . Years of Education: N/A   Occupational History  . Not on file.   Social History Main Topics  . Smoking status: Former Smoker    Quit date: 03/26/2012  . Smokeless tobacco: Not on file  . Alcohol Use: No  . Drug Use: No  . Sexual Activity: Yes    Partners: Male   Other Topics Concern  . Not on file   Social History Narrative  . No narrative on file     Objective: BP 121/68  Pulse 69  Wt 146 lb (66.225 kg)  Vital signs reviewed. General: Alert and Oriented, No Acute  Distress HEENT: Pupils equal, round, reactive to light. Conjunctivae clear.  External ears unremarkable.  Moist mucous membranes. Lungs: Clear and comfortable work of breathing, speaking in full sentences without accessory muscle use. Cardiac: Regular rate and rhythm.  Neuro: CN II-XII grossly intact, gait normal. Extremities: No peripheral edema.  Strong peripheral pulses.  Mental Status: No depression, anxiety, nor agitation. Logical though process. Skin: Warm and dry.  Assessment & Plan: Rebecca Berry was seen today for hypertension.  Diagnoses and associated orders for this visit:  Essential hypertension, benign - COMPLETE METABOLIC PANEL WITH GFR - metoprolol succinate (TOPROL-XL) 50 MG 24 hr tablet; Take 1 tablet (50 mg total) by mouth daily. Take with or immediately following a meal. - amLODipine-benazepril (LOTREL) 5-10 MG per capsule; Take 1 capsule by mouth daily.  Hepatic steatosis - COMPLETE METABOLIC PANEL WITH GFR  Hyperlipidemia - Lipid panel  Other Orders - gabapentin (NEURONTIN) 300 MG capsule; Take 1 capsule (300 mg total) by mouth 3 (three) times daily.    Essential hypertension: Controlled continue metoprolol and Lotrel, checking renal function Hepatic steatosis: Clinically asymptomatic, checking liver function panel given recent cessation of alcohol Hyperlipidemia: Due for repeat lipid panel while fasting tomorrow, checking liver enzymes for possible hepatic inflammation  She will receive the flu shot today  Return in  about 6 months (around 11/29/2014).

## 2014-06-01 LAB — COMPLETE METABOLIC PANEL WITH GFR
ALT: 20 U/L (ref 0–35)
AST: 20 U/L (ref 0–37)
Albumin: 4.1 g/dL (ref 3.5–5.2)
Alkaline Phosphatase: 97 U/L (ref 39–117)
BUN: 18 mg/dL (ref 6–23)
CALCIUM: 9.2 mg/dL (ref 8.4–10.5)
CHLORIDE: 105 meq/L (ref 96–112)
CO2: 28 meq/L (ref 19–32)
CREATININE: 0.64 mg/dL (ref 0.50–1.10)
GFR, Est Non African American: >89 mL/min
Glucose, Bld: 86 mg/dL (ref 70–99)
POTASSIUM: 4.3 meq/L (ref 3.5–5.3)
Sodium: 138 mEq/L (ref 135–145)
Total Bilirubin: 0.8 mg/dL (ref 0.2–1.2)
Total Protein: 6.2 g/dL (ref 6.0–8.3)

## 2014-06-01 LAB — LIPID PANEL
CHOLESTEROL: 114 mg/dL (ref 0–200)
HDL: 51 mg/dL (ref >39)
LDL Cholesterol: 49 mg/dL (ref 0–99)
TRIGLYCERIDES: 69 mg/dL (ref <150)
Total CHOL/HDL Ratio: 2.2 Ratio
VLDL: 14 mg/dL (ref 0–40)

## 2014-06-02 ENCOUNTER — Other Ambulatory Visit: Payer: Self-pay | Admitting: *Deleted

## 2014-06-02 DIAGNOSIS — E785 Hyperlipidemia, unspecified: Secondary | ICD-10-CM

## 2014-06-02 DIAGNOSIS — I700 Atherosclerosis of aorta: Secondary | ICD-10-CM

## 2014-06-02 MED ORDER — ATORVASTATIN CALCIUM 40 MG PO TABS: 40.0000 mg | ORAL_TABLET | Freq: Every day | ORAL | Status: DC

## 2014-06-12 ENCOUNTER — Encounter: Payer: Self-pay | Admitting: Physician Assistant

## 2014-06-12 ENCOUNTER — Ambulatory Visit (INDEPENDENT_AMBULATORY_CARE_PROVIDER_SITE_OTHER): Payer: Managed Care, Other (non HMO) | Admitting: Physician Assistant

## 2014-06-12 ENCOUNTER — Other Ambulatory Visit (HOSPITAL_COMMUNITY)
Admission: RE | Admit: 2014-06-12 | Discharge: 2014-06-12 | Disposition: A | Discharge status: Home / Self Care | Payer: Managed Care, Other (non HMO) | Source: Ambulatory Visit | Attending: Physician Assistant | Admitting: Physician Assistant

## 2014-06-12 VITALS — BP 103/58 | HR 66 | Ht 64.0 in | Wt 148.0 lb

## 2014-06-12 DIAGNOSIS — Z1151 Encounter for screening for human papillomavirus (HPV): Secondary | ICD-10-CM | POA: Diagnosis present

## 2014-06-12 DIAGNOSIS — Z01419 Encounter for gynecological examination (general) (routine) without abnormal findings: Secondary | ICD-10-CM | POA: Insufficient documentation

## 2014-06-12 DIAGNOSIS — R031 Nonspecific low blood-pressure reading: Secondary | ICD-10-CM

## 2014-06-12 NOTE — Progress Notes (Signed)
  Subjective:     Rebecca Berry is a 53 y.o. woman who comes in today for a  pap smear only. Her most recent annual exam was on 05/30/14. Her most recent Pap smear was unknown  and showed no abnormalities. Previous abnormal Pap smears: no. Contraception: post menopausal status  The following portions of the patient's history were reviewed and updated as appropriate: allergies, current medications, past family history, past medical history, past social history, past surgical history and problem list.  Review of Systems A comprehensive review of systems was negative.   Objective:    BP 103/58  Pulse 66  Ht 5\' 4"  (1.626 m)  Wt 148 lb (67.132 kg)  BMI 25.39 kg/m2 Pelvic Exam: cervix normal in appearance, external genitalia normal and vagina normal without discharge. Pap smear obtained.   Assessment:    Screening pap smear.   Plan:  Low BP reading today- decreased metoprolol to one/half tablet daily. Continue on lotrel as directed. Follow up with PCP in 1-2 months. Discussed hypotensive symptoms and to call office if having any. .   Pt reports getting flu shot here in office but not recorded. Rebecca Berry was notified and getting to bottom of this.    Follow up in 3 year, or as indicated by Pap results.

## 2014-06-12 NOTE — Patient Instructions (Addendum)
Metoprolol take 1/2 tablet to equal 25mg  daily. Follow up with Dr. Ivan AnchorsHommel.

## 2014-06-13 LAB — CYTOLOGY - PAP

## 2014-08-15 ENCOUNTER — Encounter: Payer: Self-pay | Admitting: *Deleted

## 2014-08-15 ENCOUNTER — Emergency Department (INDEPENDENT_AMBULATORY_CARE_PROVIDER_SITE_OTHER)
Admission: EM | Admit: 2014-08-15 | Discharge: 2014-08-15 | Disposition: A | Discharge status: Home / Self Care | Payer: BC Managed Care – PPO | Source: Home / Self Care | Attending: Family Medicine | Admitting: Family Medicine

## 2014-08-15 DIAGNOSIS — M869 Osteomyelitis, unspecified: Secondary | ICD-10-CM

## 2014-08-15 DIAGNOSIS — M76892 Other specified enthesopathies of left lower limb, excluding foot: Secondary | ICD-10-CM

## 2014-08-15 LAB — POCT URINALYSIS DIP (MANUAL ENTRY)
BILIRUBIN UA: NEGATIVE
BILIRUBIN UA: NEGATIVE
Blood, UA: NEGATIVE
Glucose, UA: NEGATIVE
Nitrite, UA: NEGATIVE
PH UA: 6 (ref 5–8)
PROTEIN UA: NEGATIVE
Spec Grav, UA: 1.01 (ref 1.005–1.03)
Urobilinogen, UA: 0.2 (ref 0–1)

## 2014-08-15 MED ORDER — MELOXICAM 15 MG PO TABS: 15.0000 mg | ORAL_TABLET | Freq: Every day | ORAL | Status: DC

## 2014-08-15 MED ORDER — HYDROCODONE-ACETAMINOPHEN 5-325 MG PO TABS: ORAL_TABLET | ORAL | Status: DC

## 2014-08-15 NOTE — Discharge Instructions (Signed)
Apply ice pack for 20 to 30 minutes, 3 to 4 times daily  Continue until pain decreases.  Avoid lifting. Begin stretching exercises as tolerated   Osteitis Pubis with Rehab Osteitis pubis is an overuse injury of the joint in the front of the pelvis (symphysis pubis), which connects the two pubic bones of the pelvis. The joint is lined with cartilage, surrounded by a joint covering (capsule), and filled with joint (synovial) fluid. Osteitis pubis involves bone loss (resorption) of the ends of the pubic bones, which causes pain. The cause of osteitis pubis is unknown, but it may be a reaction to repeated stress on the pubic bone. SYMPTOMS   Pain, discomfort tenderness, and swelling at the front of the pelvis, over the pubic symphysis joint.  Pain that possibly extends to the groin, inner thigh, or lower belly.  Symptoms that slowly increase in frequency and eventually become constant.  Pain that gets worse when pivoting on one leg, kicking a ball, sprinting, jumping, climbing stairs, or suddenly changing direction while running. Pain that gets worse with stretching, particularly separating the legs and thighs, or with bringing the thighs and legs together against resistance.  Walking or running with a limp.  Weakness when bending the hip or kicking.  Sometimes, clicking in the front of the pelvis.  Possibly, no symptoms. CAUSES  The cause of osteitis pubis is unknown. However, it is believed to be caused by repeated stress on the pubic bone, that occurs faster than the body can heal.  RISK INCREASES WITH:   Sports that require repetitive kicking (soccer or football kicking), sports that require repetitive jumping, as well as distance runners, fencers, ice hockey players, and weightlifters.  Poor strength and flexibility.  Previous osteitis pubis.  Previous sprain or injury to the pelvis.  Stiffness or loss of hip motion.  Previous hip injury.  Rheumatoid arthritis of the spine  (ankylosing spondylitis).  Bladder or prostate surgery. PREVENTION  Avoid trauma to the hip.  Maintain physical fitness:  Strength, flexibility, and endurance.  Cardiovascular fitness.  Learn and use proper technique. PROGNOSIS  If treated properly, osteitis pubis is usually curable within 3 to 8 months. RELATED COMPLICATIONS   Recurring symptoms, especially if activity is resumed too soon.  Longer healing time, if usual activities are resumed too soon.  Chronic pain and inflammation of the joint in the front of the pelvis.  Unstable or arthritic joint, following continued injury or delayed treatment. TREATMENT Osteitis pubis should only be treated if symptoms are present. Treatment first involves use of ice and medicine to reduce pain and inflammation. It is important to modify activities that aggravate symptoms, so there is little to no pain during activity. If an activity cannot be performed without pain, it should be eliminated. The use of strengthening and stretching exercises may help reduce pain with activity. Exercises may be performed at home or with a therapist. Sometimes, corticosteroids may be given by injection or pills, to help reduce inflammation. It is necessary to return to sports gradually, after symptoms have gone away. If symptoms persist, despite non-surgical treatment, or you are unwilling or unable to give up certain activities, surgery may be needed. Surgery may involve removing motion from the joint (fusing) or cleaning the joint.  MEDICATION   If pain medicine is needed, nonsteroidal anti-inflammatory medicines (aspirin and ibuprofen), or other minor pain relievers (acetaminophen), are often advised.  Do not take pain medicine for 7 days before surgery.  Prescription pain relievers may be given,  if your caregiver thinks they are needed. Use only as directed and only as much as you need.  Corticosteroid injections may be given by your caregiver. These  injections should be reserved for the most serious cases, because they may only be given a certain number of times. HEAT AND COLD  Cold treatment (icing) should be applied for 10 to 15 minutes every 2 to 3 hours for inflammation and pain, and immediately after activity that aggravates your symptoms. Use ice packs or an ice massage.  Heat treatment may be used before performing stretching and strengthening activities prescribed by your caregiver, physical therapist, or athletic trainer. Use a heat pack or a warm water soak. SEEK MEDICAL CARE IF:  Pain, tenderness, or swelling gets worse or does not improve, despite 2 to 6 weeks of treatment.  New, unexplained symptoms develop. (Drugs used in treatment may produce side effects.) EXERCISES RANGE OF MOTION (ROM) AND STRETCHING EXERCISES - Osteitis Pubis These exercises may help you when beginning to rehabilitate your injury. Doing them too aggressively can make your condition worse. Complete them slowly and gently. Your symptoms may go away with or without further involvement from your physician, physical therapist or athletic trainer. While completing these exercises, remember:   Restoring tissue flexibility helps normal motion to return to the joints. This allows healthier, less painful movement and activity.  An effective stretch should be held for at least 30 seconds.  A stretch should never be painful. You should only feel a gentle lengthening or release in the stretched tissue. If these stretches make your symptoms worse, even when done gently, consult your physician, physical therapist or athletic trainer. STRETCH - Hamstrings/Adductors, V-Sit  Sit on the floor with your legs extended in a large "V," keeping your knees straight.  With your head and chest upright, bend at your waist, reaching for your left foot to stretch your right thigh muscles.  You should feel a stretch in your right inner thigh. Hold for _____5_____  seconds.  Return to the upright position to relax your leg muscles.  Continuing to keep your chest upright, bend straight forward at your waist, to stretch your hamstrings.  You should feel a stretch behind both of your thighs and knees. Hold for _____5_____ seconds.  Return to the upright position to relax your leg muscles.  With your head and chest upright, bend at your waist, reaching for your right foot to stretch your left thigh muscles.  You should feel a stretch in your left inner thigh. Hold for _____5_____ seconds.  Return to the upright position to relax your leg muscles. Repeat _____10_____ times. Complete this exercise ____1______ times per day.  STRETCHING - Hip Flexors, Lunge  Half kneel with your right / left knee on the floor and your opposite knee bent and directly over your ankle.  Keep good posture with your head over your shoulders. Tighten your buttocks to point your tailbone downward. This will prevent your back from arching too much.  You should feel a gentle stretch in the front of your thigh and hip. If you do not feel any resistance, slightly slide your opposite foot forward and then slowly lunge forward, so your knee once again lines up over your ankle. Be sure your tailbone remains pointed downward.  Hold this stretch for _____5_____ seconds. Repeat _____5_____ times. Complete this stretch _____2_____ times per day. STRETCH - Adductors, Lunge  While standing, spread your legs, with your right / left leg behind you.  Lean away  from your right / left leg, by bending your opposite knee. You may rest your hands on your thigh for balance.  You should feel a stretch in your right / left inner thigh. Hold for __________ seconds. Repeat ____10______ times. Complete this exercise ____1____ times per day.  STRETCH - Adductors, Standing  Place your right / left foot on a counter or stable table. Turn away from your leg, so both hips line up with your right / left  leg.  Keeping your hips facing forward, slowly bend your opposite leg until you feel a gentle stretch on the inside of your right / left thigh.  Hold for ____5______ seconds. Repeat _____10_____ times. Complete this exercise _____1_____ times per day.  STRENGTHENING EXERCISES - Osteitis Pubis These exercises may help you when beginning to rehabilitate your injury. They may resolve your symptoms with or without further involvement from your physician, physical therapist or athletic trainer. While completing these exercises, remember:   Muscles can gain both the endurance and the strength needed for everyday activities through controlled exercises.  Complete these exercises as instructed by your physician, physical therapist or athletic trainer. Progress the resistance and repetitions only as guided. STRENGTH - Hip Adductors, isometrics  Sit on a firm chair, so that your knees are at about the same height as your hips.  Place a large ball, firm pillow or rolled up bath towel between your thighs.  Squeeze your thighs together, gradually building tension. Hold for _____5_____ seconds.  Release the tension gradually, and allow your inner thigh muscles to relax completely before repeating the exercise. Repeat _____10_____ times. Complete this exercise _____1_____ times per day.  STRENGTH - Hip Adduction  Lie on your side, with your right / left leg on the bottom.  Place the foot of your top leg flat on the floor for balance. It may be in front or behind the bottom leg.  Lift the bottom leg. Hold this position for ____5______ seconds.  Slowly lower your leg to the starting position.  Repeat exercise _____10____ times, _____1_____ times per day. Document Released: 08/11/2005 Document Revised: 11/03/2011 Document Reviewed: 11/23/2008 [facility] Patient Information 2015 [employer], [place]. This information is not intended to replace advice given to you by your health care provider. Make sure you  discuss any questions you have with your health care provider.

## 2014-08-15 NOTE — ED Notes (Signed)
Pt c/o lower abd pain x 1wk, more on the LT. NSAIDS no help. denies fever, V/D or constipation.

## 2014-08-15 NOTE — ED Provider Notes (Signed)
CSN: [id number]     Arrival date & time 08/15/14  0806 History   First MD Initiated Contact with Patient 08/15/14 [phone]     Chief Complaint  Patient presents with  . Abdominal Pain      HPI Comments: Patient recalls sliding on a slippery floor six days ago, stretching her left leg and groin but did not fall.  Subsequently she has had pain in her left lower abdomen and groin, worse when walking and coughing.  She feels well otherwise; no systemic symptoms.  Patient is a 53 y.o. female presenting with abdominal pain. The history is provided by the patient.  Abdominal Pain Pain location:  Suprapubic Pain quality: aching and sharp   Pain quality: not bloating, not cramping and no fullness   Pain radiates to:  Does not radiate Pain severity:  Moderate Onset quality:  Sudden Duration:  6 days Timing:  Constant Progression:  Unchanged Chronicity:  New Context comment:  Slipping on a floor Relieved by:  Nothing Exacerbated by: walking. Ineffective treatments:  NSAIDs Associated symptoms: no chest pain, no chills, no constipation, no dysuria, no fatigue, no fever, no hematuria, no nausea, no vaginal discharge and no vomiting     Past Medical History  Diagnosis Date  . Pacemaker syndrome   . Hypertension   . Hyperlipidemia    Past Surgical History  Procedure Laterality Date  . Tonsillectomy and adenoidectomy  1962  . Breast biopsy  12/1997  . Cholecystectomy  05/2003  . Biceps tendon repair  08/2007   Family History  Problem Relation Age of Onset  . Alcoholism Father   . Bladder Cancer Father   . Lung cancer Mother   . Heart attack Father   . Hypertension Father   . Leukemia Paternal Grandmother    History  Substance Use Topics  . Smoking status: Former Smoker    Quit date: 03/26/2012  . Smokeless tobacco: Not on file  . Alcohol Use: No   OB History    No data available     Review of Systems  Constitutional: Negative for fever, chills and fatigue.  Cardiovascular:  Negative for chest pain.  Gastrointestinal: Positive for abdominal pain. Negative for nausea, vomiting and constipation.  Genitourinary: Negative for dysuria, hematuria and vaginal discharge.  All other systems reviewed and are negative.   Allergies  Vibramycin  Home Medications   Prior to Admission medications   Medication Sig Start Date End Date Taking? Authorizing Provider  amLODipine-benazepril (LOTREL) 5-10 MG per capsule Take 1 capsule by mouth daily. 05/30/14   Laren BoomSean Hommel, DO  Ascorbic Acid (VITAMIN C PO) Take by mouth.    Historical Provider, MD  aspirin 81 MG tablet Take 1 tablet (81 mg total) by mouth daily. When not taking daily NSAIDs 03/16/14   Sean Hommel, DO  atorvastatin (LIPITOR) 40 MG tablet Take 1 tablet (40 mg total) by mouth daily. 06/02/14   Sean Hommel, DO  fluticasone (FLONASE) 50 MCG/ACT nasal spray Place 2 sprays into both nostrils daily. 05/02/14   Jade L Breeback, PA-C  gabapentin (NEURONTIN) 300 MG capsule Take 1 capsule (300 mg total) by mouth 3 (three) times daily. 05/30/14   Laren BoomSean Hommel, DO  HYDROcodone-acetaminophen (NORCO/VICODIN) 5-325 MG per tablet Take one by mouth at bedtime as needed for pain 08/15/14   Lattie HawStephen A Beese, MD  meloxicam (MOBIC) 15 MG tablet Take 1 tablet (15 mg total) by mouth daily. Take with food each morning 08/15/14   Lattie HawStephen A Beese, MD  metoprolol succinate (TOPROL-XL) 50 MG 24 hr tablet Take 25 mg by mouth daily. Take with or immediately following a meal. 05/30/14   Sean Hommel, DO  SUMAtriptan (IMITREX) 50 MG tablet Take 50 mg by mouth every 2 (two) hours as needed for migraine or headache. May repeat in 2 hours if headache persists or recurs.    Historical Provider, MD   BP 144/73 mmHg  Pulse 73  Temp(Src) 97.7 F (36.5 C) (Oral)  Resp 16  Ht 5\' 3"  (1.6 m)  Wt 148 lb (67.132 kg)  BMI 26.22 kg/m2  SpO2 100% Physical Exam  Constitutional: She is oriented to person, place, and time. She appears well-developed and well-nourished.  No distress.  HENT:  Head: Normocephalic.  Mouth/Throat: Oropharynx is clear and moist.  Eyes: Conjunctivae are normal. Pupils are equal, round, and reactive to light.  Neck: Neck supple.  Cardiovascular: Normal heart sounds.   Pulmonary/Chest: Breath sounds normal.  Abdominal: Bowel sounds are normal. There is no tenderness.  Musculoskeletal: She exhibits no edema.       Left hip: She exhibits decreased range of motion, decreased strength and tenderness. She exhibits no bony tenderness, no swelling, no crepitus and no deformity.       Legs: Left inguinal area has distinct tenderness to palpation over hip flexors.  Resisted flexion of the left hip while palpating there recreates her pain.  She also has marked tenderness over the symphysis pubis.  Resisted adduction of both hips causes pain over the symphysis pubis.  Lymphadenopathy:    She has no cervical adenopathy.  Neurological: She is alert and oriented to person, place, and time.  Skin: Skin is warm and dry. No rash noted.  Vitals reviewed.   ED Course  Procedures  None   Labs Reviewed  POCT URINALYSIS DIP (MANUAL ENTRY):  LEU small; otherwise negative    MDM   1. Hip abductor tendinitis, left   2. Osteitis pubis    Begin Mobic.  Lortab for pain at bedtime. Apply ice pack for 20 to 30 minutes, 3 to 4 times daily  Continue until pain decreases.  Avoid lifting. Begin stretching exercises as tolerated Followup with Dr. Rodney Langtonhomas Thekkekand[facility] if not improving about two weeks.     Lattie HawStephen A Beese, MD 08/21/14 [phone]

## 2014-08-23 ENCOUNTER — Telehealth: Payer: Self-pay | Admitting: *Deleted

## 2014-08-23 NOTE — Telephone Encounter (Signed)
Pt left a message on vm that she pulled a muscle in her abd 2 weeks ago and no pain meds are working for her. Called her and asked that she schedule an appt with Dr. Ivan Anchorshommel

## 2014-08-24 ENCOUNTER — Encounter: Payer: Self-pay | Admitting: Family Medicine

## 2014-08-24 ENCOUNTER — Ambulatory Visit (INDEPENDENT_AMBULATORY_CARE_PROVIDER_SITE_OTHER): Payer: BC Managed Care – PPO | Admitting: Family Medicine

## 2014-08-24 ENCOUNTER — Ambulatory Visit (INDEPENDENT_AMBULATORY_CARE_PROVIDER_SITE_OTHER): Payer: BC Managed Care – PPO

## 2014-08-24 ENCOUNTER — Telehealth: Payer: Self-pay | Admitting: Family Medicine

## 2014-08-24 VITALS — BP 131/77 | HR 82 | Wt 148.0 lb

## 2014-08-24 DIAGNOSIS — R1032 Left lower quadrant pain: Secondary | ICD-10-CM

## 2014-08-24 DIAGNOSIS — R102 Pelvic and perineal pain: Secondary | ICD-10-CM

## 2014-08-24 DIAGNOSIS — S39011A Strain of muscle, fascia and tendon of abdomen, initial encounter: Secondary | ICD-10-CM

## 2014-08-24 MED ORDER — OXYCODONE-ACETAMINOPHEN 5-325 MG PO TABS: 1.0000 | ORAL_TABLET | Freq: Three times a day (TID) | ORAL | Status: DC | PRN

## 2014-08-24 NOTE — Progress Notes (Signed)
CC: Rebecca FifeKatherine Berry is a 53 y.o. female is here for abdominal strain?   Subjective: HPI:  Left lower quadrant abdominal pain that has been present for the last 2-1/2 weeks. Symptoms came on suddenly after she slipped while standing flexed out her right hip suddenly and suddenly regained her balance with her right hip flexed and leg extended. She did not fall. Symptoms have been fluctuated from moderate to severe in severity since onset. They've slightly improved over the past 2 days to only moderate in severity. Symptoms are absent when lying or sitting still. Absent when standing still. Any movement that involves going from a seated to standing position or twisting of the torso reproduces her pain. Pain is slightly improved with applying pressure to the left lower quadrant with her hands. No benefit from meloxicam. Only minimal benefit from 5 mg of hydrocodone at nighttime. She wakes at night frequently due to pain when turning. She denies any constipation or diarrhea. Pain is also reproduced with sneezing, coughing, or Valsalva. Denies any vomiting, nausea, decreased appetite nor genitourinary complaints.   Review Of Systems Outlined In HPI  Past Medical History  Diagnosis Date  . Pacemaker syndrome   . Hypertension   . Hyperlipidemia     Past Surgical History  Procedure Laterality Date  . Tonsillectomy and adenoidectomy  1962  . Breast biopsy  12/1997  . Cholecystectomy  05/2003  . Biceps tendon repair  08/2007   Family History  Problem Relation Age of Onset  . Alcoholism Father   . Bladder Cancer Father   . Lung cancer Mother   . Heart attack Father   . Hypertension Father   . Leukemia Paternal Grandmother     History   Social History  . Marital Status: Married    Spouse Name: N/A    Number of Children: N/A  . Years of Education: N/A   Occupational History  . Not on file.   Social History Main Topics  . Smoking status: Former Smoker    Quit date: 03/26/2012  .  Smokeless tobacco: Not on file  . Alcohol Use: No  . Drug Use: No  . Sexual Activity:    Partners: Male   Other Topics Concern  . Not on file   Social History Narrative     Objective: BP 131/77 mmHg  Pulse 82  Wt 148 lb (67.132 kg)  General: Alert and Oriented, No Acute Distress HEENT: Pupils equal, round, reactive to light. Conjunctivae clear.  Moist mucous membranes Lungs:clear-cut for work of breathing Cardiac: Regular rate and rhythm.  Abdomen: Normal bowel sounds, soft without guarding or rebound. No palpable masses in any of the 4 quadrants. No and umbilical hernia. No palpable inguinal hernia bilaterally. Pain is reproduced with resisted left quadricep flexion. Log roll and internal/external rotation of the left femur does not reproduce pain. Mild reproduction of pain with resisted abduction of the quadriceps. Extremities: No peripheral edema.  Strong peripheral pulses.  Mental Status: No depression, anxiety, nor agitation. Skin: Warm and dry.  Assessment & Plan: Rebecca LeatherwoodKatherine was seen today for abdominal strain?.  Diagnoses and associated orders for this visit:  Abdominal pain, left lower quadrant - oxyCODONE-acetaminophen (PERCOCET/ROXICET) 5-325 MG per tablet; Take 1-2 tablets by mouth every 8 (eight) hours as needed for severe pain. - DG Pelvis 1-2 Views; Future    Abdominal pain: I suspicion of muscular etiology of her pain, low suspicion of hernia at this point. Given the degree and persistence of her pain I like  to rule out pubic symphysis dislocation or avulsion of any of the abdominal musculature around the pelvis. X-ray has been obtained. Ultimate plan will be based on the results of this. For now begin oxycodone for better pain control. Discussed that I anticipate physical therapy sometime in the future based on x-ray results today.  Return if symptoms worsen or fail to improve.

## 2014-08-24 NOTE — Telephone Encounter (Signed)
Rebecca Berry, Will you please let patient know that her xrays did not show any dislocation nor avulsion which is reassuring. I believe her pain is coming from a torn abdominal muscle.  To help speed up recovery I'd recommend she see a physical therapist therefore I've placed a referral for this today.

## 2014-08-24 NOTE — Telephone Encounter (Signed)
Pt.notified

## 2014-09-05 ENCOUNTER — Other Ambulatory Visit: Payer: Self-pay | Admitting: *Deleted

## 2014-09-05 DIAGNOSIS — I10 Essential (primary) hypertension: Secondary | ICD-10-CM

## 2014-09-05 MED ORDER — AMLODIPINE BESY-BENAZEPRIL HCL 5-10 MG PO CAPS: 1.0000 | ORAL_CAPSULE | Freq: Every day | ORAL | Status: DC

## 2014-09-12 ENCOUNTER — Ambulatory Visit (INDEPENDENT_AMBULATORY_CARE_PROVIDER_SITE_OTHER): Payer: BLUE CROSS/BLUE SHIELD | Admitting: Family Medicine

## 2014-09-12 ENCOUNTER — Encounter: Payer: Self-pay | Admitting: Family Medicine

## 2014-09-12 VITALS — BP 132/72 | HR 74 | Wt 148.0 lb

## 2014-09-12 DIAGNOSIS — E785 Hyperlipidemia, unspecified: Secondary | ICD-10-CM | POA: Diagnosis not present

## 2014-09-12 DIAGNOSIS — F411 Generalized anxiety disorder: Secondary | ICD-10-CM

## 2014-09-12 DIAGNOSIS — L03211 Cellulitis of face: Secondary | ICD-10-CM

## 2014-09-12 LAB — LIPID PANEL
Cholesterol: 134 mg/dL (ref 0–200)
HDL: 61 mg/dL (ref >39)
LDL Cholesterol: 55 mg/dL (ref 0–99)
TRIGLYCERIDES: 90 mg/dL (ref <150)
Total CHOL/HDL Ratio: 2.2 Ratio
VLDL: 18 mg/dL (ref 0–40)

## 2014-09-12 MED ORDER — CEPHALEXIN 500 MG PO CAPS: 500.0000 mg | ORAL_CAPSULE | Freq: Three times a day (TID) | ORAL | Status: DC

## 2014-09-12 NOTE — Progress Notes (Signed)
CC: Rebecca Berry is a 54 y.o. female is here for Follow-up   Subjective: HPI:  Follow-up hyperlipidemia: Since the fall now she's been on atorvastatin 40 mg daily without known side effects. We are using this to treat hyperlipidemia along with moderate plaque buildup in her aorta iliac arteries. She denies any chest pain, limb claudication, pain with elevation of the extremities. She denies any myalgias nor right upper quadrant pain. No known intolerance to atorvastatin. Trying to watch what she eats no formal exercise routine  Complains of redness and tenderness on the left chin that has been present for 1 week. It originally looked like a pimple however has been resolving after she popped it. Interventions have included keeping it clean. She gets these frequently and has had to have abscesses drained in the past. She's worried that one of these might occur and interfere with pictures leading up to the wedding of her daughter or on the day of the wedding. She denies any skin changes elsewhere. There's been no fevers, chills nor intentional weight loss  She expresses frustration and feeling stressed out about the planning of her daughter's wedding that is occurring in July. This has been complicated by her other son getting another girl pregnant before they were married themselves. She is having keep this a secret from other family members such as her ex-husband. She feels like she is juggling too much obligations from other family members right now. She wants something that she can take to help with her nerves in the future should they get worse. She denies any mental disturbance other than above   Review Of Systems Outlined In HPI  Past Medical History  Diagnosis Date  . Pacemaker syndrome   . Hypertension   . Hyperlipidemia     Past Surgical History  Procedure Laterality Date  . Tonsillectomy and adenoidectomy  1962  . Breast biopsy  12/1997  . Cholecystectomy  05/2003  . Biceps  tendon repair  08/2007   Family History  Problem Relation Age of Onset  . Alcoholism Father   . Bladder Cancer Father   . Lung cancer Mother   . Heart attack Father   . Hypertension Father   . Leukemia Paternal Grandmother     History   Social History  . Marital Status: Married    Spouse Name: N/A    Number of Children: N/A  . Years of Education: N/A   Occupational History  . Not on file.   Social History Main Topics  . Smoking status: Former Smoker    Quit date: 03/26/2012  . Smokeless tobacco: Not on file  . Alcohol Use: No  . Drug Use: No  . Sexual Activity:    Partners: Male   Other Topics Concern  . Not on file   Social History Narrative     Objective: BP 132/72 mmHg  Pulse 74  Wt 148 lb (67.132 kg)  Vital signs reviewed. General: Alert and Oriented, No Acute Distress HEENT: Pupils equal, round, reactive to light. Conjunctivae clear.  External ears unremarkable.  Moist mucous membranes. Lungs: Clear and comfortable work of breathing, speaking in full sentences without accessory muscle use. Cardiac: Regular rate and rhythm.  Neuro: CN II-XII grossly intact, gait normal. Extremities: No peripheral edema.  Strong peripheral pulses.  Mental Status: No depression, nor agitation. Logical thought process with mild anxiety. Requiring frequent redirection to medical concerns Skin: Warm and dry. Single small papule nontender on the left chin without induration or fluctuance  Assessment & Plan: Rebecca Berry was seen today for follow-up.  Diagnoses and associated orders for this visit:  Hyperlipidemia - Lipid panel  Cellulitis, face - cephALEXin (KEFLEX) 500 MG capsule; Take 1 capsule (500 mg total) by mouth 3 (three) times daily. Only use for 10 days for any staph infection breakout.  Anxiety state    Hyperlipidemia: Continue atorvastatin pending lipid panel today Cellulitis of the face: Discussed with her that this is like it's resolving however if she gets  any lesion like this in the future if she starts Keflex within the first 24 hours I would expect that it would expedite the resolution of the lesion which is probably a mild staph infection. I'm only doing this since she is extremely concerned about this interfering with pictures for her daughter's wedding Anxiety: Time was taken to listen to her concerns regarding multiple family obligations and however it is interfering with her life. Joint decision that things are not bad enough to take medication to help with symptoms right now but that there are options available in the future.  40 minutes spent face-to-face during visit today of which at least 50% was counseling or coordinating care regarding: 1. Hyperlipidemia   2. Cellulitis, face   3. Anxiety state      Return if symptoms worsen or fail to improve.

## 2014-09-13 ENCOUNTER — Other Ambulatory Visit: Payer: Self-pay | Admitting: Family Medicine

## 2014-09-13 DIAGNOSIS — E785 Hyperlipidemia, unspecified: Secondary | ICD-10-CM

## 2014-09-13 DIAGNOSIS — I700 Atherosclerosis of aorta: Secondary | ICD-10-CM

## 2014-09-13 MED ORDER — ATORVASTATIN CALCIUM 40 MG PO TABS: 40.0000 mg | ORAL_TABLET | Freq: Every day | ORAL | Status: DC

## 2014-12-15 ENCOUNTER — Other Ambulatory Visit: Payer: Self-pay | Admitting: Family Medicine

## 2014-12-20 MED ORDER — METOPROLOL SUCCINATE ER 25 MG PO TB24: 25.0000 mg | ORAL_TABLET | Freq: Every day | ORAL | Status: DC

## 2014-12-20 NOTE — Addendum Note (Signed)
Addended by: Laren BoomHOMMEL, Rishit Burkhalter on: 12/20/2014 05:03 PM   Modules accepted: Orders

## 2015-01-05 ENCOUNTER — Telehealth: Payer: Self-pay

## 2015-01-05 ENCOUNTER — Telehealth: Payer: Self-pay | Admitting: *Deleted

## 2015-01-05 DIAGNOSIS — Z0000 Encounter for general adult medical examination without abnormal findings: Secondary | ICD-10-CM

## 2015-01-05 LAB — CBC WITH DIFFERENTIAL/PLATELET
Basophils Absolute: 0 10*3/uL (ref 0.0–0.1)
Basophils Relative: 0 % (ref 0–1)
EOS ABS: 0.1 10*3/uL (ref 0.0–0.7)
Eosinophils Relative: 2 % (ref 0–5)
HEMATOCRIT: 38.8 % (ref 36.0–46.0)
HEMOGLOBIN: 12.8 g/dL (ref 12.0–15.0)
LYMPHS PCT: 34 % (ref 12–46)
Lymphs Abs: 2 10*3/uL (ref 0.7–4.0)
MCH: 28.1 pg (ref 26.0–34.0)
MCHC: 33 g/dL (ref 30.0–36.0)
MCV: 85.3 fL (ref 78.0–100.0)
MPV: 11 fL (ref 8.6–12.4)
Monocytes Absolute: 0.5 10*3/uL (ref 0.1–1.0)
Monocytes Relative: 8 % (ref 3–12)
NEUTROS ABS: 3.4 10*3/uL (ref 1.7–7.7)
Neutrophils Relative %: 56 % (ref 43–77)
PLATELETS: 292 10*3/uL (ref 150–400)
RBC: 4.55 MIL/uL (ref 3.87–5.11)
RDW: 13.6 % (ref 11.5–15.5)
WBC: 6 10*3/uL (ref 4.0–10.5)

## 2015-01-05 LAB — COMPLETE METABOLIC PANEL WITH GFR
ALT: 26 U/L (ref 0–35)
AST: 24 U/L (ref 0–37)
Albumin: 4.5 g/dL (ref 3.5–5.2)
Alkaline Phosphatase: 110 U/L (ref 39–117)
BILIRUBIN TOTAL: 1.1 mg/dL (ref 0.2–1.2)
BUN: 17 mg/dL (ref 6–23)
CO2: 28 mEq/L (ref 19–32)
Calcium: 9.7 mg/dL (ref 8.4–10.5)
Chloride: 102 mEq/L (ref 96–112)
Creat: 0.71 mg/dL (ref 0.50–1.10)
GFR, Est African American: >89 mL/min
Glucose, Bld: 84 mg/dL (ref 70–99)
Potassium: 4.6 mEq/L (ref 3.5–5.3)
SODIUM: 142 meq/L (ref 135–145)
Total Protein: 7.1 g/dL (ref 6.0–8.3)

## 2015-01-05 LAB — LIPID PANEL
CHOL/HDL RATIO: 2.1 ratio
Cholesterol: 124 mg/dL (ref 0–200)
HDL: 60 mg/dL (ref >=46)
LDL Cholesterol: 49 mg/dL (ref 0–99)
Triglycerides: 74 mg/dL (ref <150)
VLDL: 15 mg/dL (ref 0–40)

## 2015-01-05 NOTE — Telephone Encounter (Signed)
Faxed labs to [facility]. 

## 2015-01-11 ENCOUNTER — Ambulatory Visit (INDEPENDENT_AMBULATORY_CARE_PROVIDER_SITE_OTHER): Payer: BLUE CROSS/BLUE SHIELD | Admitting: Family Medicine

## 2015-01-11 ENCOUNTER — Other Ambulatory Visit: Payer: Self-pay | Admitting: Family Medicine

## 2015-01-11 ENCOUNTER — Encounter: Payer: Self-pay | Admitting: Family Medicine

## 2015-01-11 VITALS — BP 129/66 | HR 73 | Ht 64.0 in | Wt 146.0 lb

## 2015-01-11 DIAGNOSIS — Z0000 Encounter for general adult medical examination without abnormal findings: Secondary | ICD-10-CM | POA: Diagnosis not present

## 2015-01-11 DIAGNOSIS — I10 Essential (primary) hypertension: Secondary | ICD-10-CM

## 2015-01-11 MED ORDER — HYDROCHLOROTHIAZIDE 25 MG PO TABS: ORAL_TABLET | ORAL | Status: DC

## 2015-01-11 MED ORDER — AMLODIPINE BESY-BENAZEPRIL HCL 5-10 MG PO CAPS
1.0000 | ORAL_CAPSULE | Freq: Every day | ORAL | Status: DC
Start: 2015-01-11 — End: 2015-05-17

## 2015-01-11 NOTE — Telephone Encounter (Signed)
closed

## 2015-01-11 NOTE — Progress Notes (Signed)
CC: Rebecca Berry is a 54 y.o. female is here for Annual Exam   Subjective: HPI:  Colonoscopy: Colonoscopy in 2013 for bleeding which was unremarkable other than hemorrhoids Papsmear: Normal October 2015 Mammogram: Normal September 2015  Influenza Vaccine: 07/11/2013 Pneumovax: no current indication Td/Tdap: 05/2013 UTD Zoster: (Start 55 yo)  Requesting a BP medication that will help her lose water, she feels like she's retaining fluid symmetrically in her extremities.  Review of Systems - General ROS: negative for - chills, fever, night sweats, weight gain or weight loss Ophthalmic ROS: negative for - decreased vision Psychological ROS: negative for - anxiety or depression ENT ROS: negative for - hearing change, nasal congestion, tinnitus or allergies Hematological and Lymphatic ROS: negative for - bleeding problems, bruising or swollen lymph nodes Breast ROS: negative Respiratory ROS: no cough, shortness of breath, or wheezing Cardiovascular ROS: no chest pain or dyspnea on exertion Gastrointestinal ROS: no abdominal pain, change in bowel habits, or black or bloody stools Genito-Urinary ROS: negative for - genital discharge, genital ulcers, incontinence or abnormal bleeding from genitals Musculoskeletal ROS: negative for - joint pain or muscle pain Neurological ROS: negative for - headaches or memory loss Dermatological ROS: negative for lumps, mole changes, rash and skin lesion changes  Past Medical History  Diagnosis Date  . Pacemaker syndrome   . Hypertension   . Hyperlipidemia     Past Surgical History  Procedure Laterality Date  . Tonsillectomy and adenoidectomy  1962  . Breast biopsy  12/1997  . Cholecystectomy  05/2003  . Biceps tendon repair  08/2007   Family History  Problem Relation Age of Onset  . Alcoholism Father   . Bladder Cancer Father   . Lung cancer Mother   . Heart attack Father   . Hypertension Father   . Leukemia Paternal Grandmother      History   Social History  . Marital Status: Married    Spouse Name: N/A  . Number of Children: N/A  . Years of Education: N/A   Occupational History  . Not on file.   Social History Main Topics  . Smoking status: Former Smoker    Quit date: 03/26/2012  . Smokeless tobacco: Not on file  . Alcohol Use: No  . Drug Use: No  . Sexual Activity:    Partners: Male   Other Topics Concern  . Not on file   Social History Narrative     Objective: BP 129/66 mmHg  Pulse 73  Ht 5\' 4"  (1.626 m)  Wt 146 lb (66.225 kg)  BMI 25.05 kg/m2  General: No Acute Distress HEENT: Atraumatic, normocephalic, conjunctivae normal without scleral icterus.  No nasal discharge, hearing grossly intact, TMs with good landmarks bilaterally with no middle ear abnormalities, posterior pharynx clear without oral lesions. Neck: Supple, trachea midline, no cervical nor supraclavicular adenopathy. Pulmonary: Clear to auscultation bilaterally without wheezing, rhonchi, nor rales. Cardiac: Regular rate and rhythm.  No murmurs, rubs, nor gallops. No peripheral edema.  2+ peripheral pulses bilaterally. Abdomen: Bowel sounds normal.  No masses.  Non-tender without rebound.  Negative Murphy's sign. MSK: Grossly intact, no signs of weakness.  Full strength throughout upper and lower extremities.  Full ROM in upper and lower extremities.  No midline spinal tenderness. Neuro: Gait unremarkable, CN II-XII grossly intact.  C5-C6 Reflex 2/4 Bilaterally, L4 Reflex 2/4 Bilaterally.  Cerebellar function intact. Skin: No rashes. Psych: Alert and oriented to person/place/time.  Thought process normal. No anxiety/depression.  Assessment & Plan: Rebecca Berry was seen  today for annual exam.  Diagnoses and all orders for this visit:  Annual physical exam  Essential hypertension, benign Orders: -     hydrochlorothiazide (HYDRODIURIL) 25 MG tablet; One tablet by mouth every morning for blood pressure control. -      amLODipine-benazepril (LOTREL) 5-10 MG per capsule; Take 1 capsule by mouth daily.  Healthy lifestyle interventions including but not limited to regular exercise, a healthy low fat diet, moderation of salt intake, the dangers of tobacco/alcohol/recreational drug use, nutrition supplementation, and accident avoidance were discussed with the patient and a handout was provided for future reference.  Stop taking metoprolol, place aside but do not throw away. Begin hydrochlorothiazide in its place due to her desire to be on a diuretic.   Return in about 3 months (around 04/13/2015) for BP follow up.

## 2015-01-12 MED ORDER — DIAZEPAM 10 MG PO TABS: 5.0000 mg | ORAL_TABLET | Freq: Two times a day (BID) | ORAL | Status: DC | PRN

## 2015-01-12 MED ORDER — SUMATRIPTAN SUCCINATE 50 MG PO TABS: 50.0000 mg | ORAL_TABLET | ORAL | Status: DC | PRN

## 2015-01-12 NOTE — Addendum Note (Signed)
Addended by: Laren BoomHOMMEL, Andra Heslin on: 01/12/2015 08:16 AM   Modules accepted: Orders

## 2015-01-29 ENCOUNTER — Other Ambulatory Visit: Payer: Self-pay | Admitting: Family Medicine

## 2015-01-31 ENCOUNTER — Telehealth: Payer: Self-pay | Admitting: Family Medicine

## 2015-01-31 MED ORDER — ZOLPIDEM TARTRATE 5 MG PO TABS: 5.0000 mg | ORAL_TABLET | Freq: Every evening | ORAL | Status: DC | PRN

## 2015-01-31 NOTE — Telephone Encounter (Signed)
Sue Lushndrea, Will you contact patient and ask where she wants ambien to faxed to? She sent me a message stating OTC sleep aids are not helping. Rx in your in box

## 2015-02-01 NOTE — Telephone Encounter (Signed)
Faxed to [facility]

## 2015-04-19 ENCOUNTER — Other Ambulatory Visit: Payer: Self-pay | Admitting: Family Medicine

## 2015-05-02 ENCOUNTER — Other Ambulatory Visit: Payer: Self-pay | Admitting: Family Medicine

## 2015-05-02 MED ORDER — ZOLPIDEM TARTRATE 5 MG PO TABS: 5.0000 mg | ORAL_TABLET | Freq: Every evening | ORAL | Status: DC | PRN

## 2015-05-17 ENCOUNTER — Ambulatory Visit (INDEPENDENT_AMBULATORY_CARE_PROVIDER_SITE_OTHER): Payer: BLUE CROSS/BLUE SHIELD | Admitting: Family Medicine

## 2015-05-17 ENCOUNTER — Encounter: Payer: Self-pay | Admitting: Family Medicine

## 2015-05-17 VITALS — BP 113/47 | HR 72 | Wt 147.0 lb

## 2015-05-17 DIAGNOSIS — I10 Essential (primary) hypertension: Secondary | ICD-10-CM

## 2015-05-17 DIAGNOSIS — G4700 Insomnia, unspecified: Secondary | ICD-10-CM | POA: Diagnosis not present

## 2015-05-17 MED ORDER — HYDROCHLOROTHIAZIDE 25 MG PO TABS: ORAL_TABLET | ORAL | Status: DC

## 2015-05-17 MED ORDER — AMLODIPINE BESY-BENAZEPRIL HCL 5-10 MG PO CAPS
1.0000 | ORAL_CAPSULE | Freq: Every day | ORAL | Status: DC
Start: 2015-05-17 — End: 2016-03-13

## 2015-05-17 NOTE — Progress Notes (Signed)
CC: Rebecca Berry is a 54 y.o. female is here for Hypertension and insominia   Subjective: HPI:  Follow-up essential hypertension: At her last visit she had hydrochlorothiazide to replace metoprolol. Since then she denies irregular heartbeat, chest pain, shortness of breath orthopnea nor peripheral edema. Denies any lightheadedness or symptoms of dehydration  Follow-up insomnia: She is taking Ambien less than most nights out of the week and her frequency of needing this has drastically decreased ever since the wedding she was planning is now over. She denies any known side effects or sleep walking. It still helps on the nights that she can't fall sleep   Review Of Systems Outlined In HPI  Past Medical History  Diagnosis Date  . Pacemaker syndrome   . Hypertension   . Hyperlipidemia     Past Surgical History  Procedure Laterality Date  . Tonsillectomy and adenoidectomy  1962  . Breast biopsy  12/1997  . Cholecystectomy  05/2003  . Biceps tendon repair  08/2007   Family History  Problem Relation Age of Onset  . Alcoholism Father   . Bladder Cancer Father   . Lung cancer Mother   . Heart attack Father   . Hypertension Father   . Leukemia Paternal Grandmother     Social History   Social History  . Marital Status: Married    Spouse Name: N/A  . Number of Children: N/A  . Years of Education: N/A   Occupational History  . Not on file.   Social History Main Topics  . Smoking status: Former Smoker    Quit date: 03/26/2012  . Smokeless tobacco: Not on file  . Alcohol Use: No  . Drug Use: No  . Sexual Activity:    Partners: Male   Other Topics Concern  . Not on file   Social History Narrative     Objective: BP 113/47 mmHg  Pulse 72  Wt 147 lb (66.679 kg)  Vital signs reviewed. General: Alert and Oriented, No Acute Distress HEENT: Pupils equal, round, reactive to light. Conjunctivae clear.  External ears unremarkable.  Moist mucous membranes. Lungs: Clear  and comfortable work of breathing, speaking in full sentences without accessory muscle use. Cardiac: Regular rate and rhythm.  Neuro: CN II-XII grossly intact, gait normal. Extremities: No peripheral edema.  Strong peripheral pulses.  Mental Status: No depression, anxiety, nor agitation. Logical though process. Skin: Warm and dry. Assessment & Plan: Rebecca Berry was seen today for hypertension and insominia.  Diagnoses and all orders for this visit:  Essential hypertension, benign -     hydrochlorothiazide (HYDRODIURIL) 25 MG tablet; TAKE ONE TABLET BY MOUTH ONCE DAILY IN THE MORNING FOR BLOOD PRESSURE CONTROL -     amLODipine-benazepril (LOTREL) 5-10 MG per capsule; Take 1 capsule by mouth daily.  Insomnia   Essential hypertension: She tells me her diastolic today is much lower than what she is used to usually at home she sees diastolics 60-70. Condition is not controlled continue hydrochlorothiazide and Lotrel Insomnia: Controlled with Ambien continue as needed use  25 minutes spent face-to-face during visit today of which at least 50% was counseling or coordinating care regarding: 1. Essential hypertension, benign   2. Insomnia      Return in about 6 months (around 11/14/2015).

## 2015-05-22 ENCOUNTER — Other Ambulatory Visit: Payer: Self-pay | Admitting: Family Medicine

## 2015-05-22 DIAGNOSIS — Z1231 Encounter for screening mammogram for malignant neoplasm of breast: Secondary | ICD-10-CM

## 2015-05-26 ENCOUNTER — Encounter: Payer: Self-pay | Admitting: Emergency Medicine

## 2015-05-26 ENCOUNTER — Emergency Department (INDEPENDENT_AMBULATORY_CARE_PROVIDER_SITE_OTHER)
Admission: EM | Admit: 2015-05-26 | Discharge: 2015-05-26 | Disposition: A | Discharge status: Home / Self Care | Payer: BLUE CROSS/BLUE SHIELD | Source: Home / Self Care | Attending: Family Medicine | Admitting: Family Medicine

## 2015-05-26 DIAGNOSIS — J029 Acute pharyngitis, unspecified: Secondary | ICD-10-CM | POA: Diagnosis not present

## 2015-05-26 DIAGNOSIS — J069 Acute upper respiratory infection, unspecified: Secondary | ICD-10-CM | POA: Diagnosis not present

## 2015-05-26 DIAGNOSIS — B9789 Other viral agents as the cause of diseases classified elsewhere: Secondary | ICD-10-CM

## 2015-05-26 LAB — POCT RAPID STREP A (OFFICE): Rapid Strep A Screen: NEGATIVE

## 2015-05-26 MED ORDER — AMOXICILLIN 875 MG PO TABS: 875.0000 mg | ORAL_TABLET | Freq: Two times a day (BID) | ORAL | Status: DC

## 2015-05-26 MED ORDER — BENZONATATE 200 MG PO CAPS: 200.0000 mg | ORAL_CAPSULE | Freq: Every day | ORAL | Status: DC

## 2015-05-26 NOTE — ED Notes (Signed)
Pt c/o sore throat and cough x 1 days, sinus drainage

## 2015-05-26 NOTE — Discharge Instructions (Signed)
Take plain guaifenesin (  extended release tabs such as Mucinex) twice daily, with plenty of water, for cough and congestion. Get adequate rest.   May use Afrin nasal spray (or generic oxymetazoline) twice daily for about 5 days and then discontinue.  Also recommend using saline nasal spray several times daily and saline nasal irrigation (AYR is a common brand).  May use Flonase nasal spray each morning after using Afrin nasal spray and saline nasal irrigation. Try warm salt water gargles for sore throat.  Stop all antihistamines for now, and other non-prescription cough/cold preparations. Begin Amoxicillin if not improving about one week or if persistent fever develops   Follow-up with family doctor if not improving about 2 weeks.

## 2015-05-26 NOTE — ED Provider Notes (Signed)
CSN: [id number]     Arrival date & time 05/26/15  1146 History   First MD Initiated Contact with Patient 05/26/15 1228     Chief Complaint  Patient presents with  . Sore Throat      HPI Comments: Patient awoke today with typical cold-like symptoms including mild sore throat, sinus congestion, fatigue, and mild cough.   The history is provided by the patient.    Past Medical History  Diagnosis Date  . Pacemaker syndrome   . Hypertension   . Hyperlipidemia    Past Surgical History  Procedure Laterality Date  . Tonsillectomy and adenoidectomy  1962  . Breast biopsy  12/1997  . Cholecystectomy  05/2003  . Biceps tendon repair  08/2007   Family History  Problem Relation Age of Onset  . Alcoholism Father   . Bladder Cancer Father   . Lung cancer Mother   . Heart attack Father   . Hypertension Father   . Leukemia Paternal Grandmother    Social History  Substance Use Topics  . Smoking status: Former Smoker    Quit date: 03/26/2012  . Smokeless tobacco: None  . Alcohol Use: No   OB History    No data available     Review of Systems + sore throat + cough No pleuritic pain No wheezing + nasal congestion + post-nasal drainage No sinus pain/pressure No itchy/red eyes No earache No hemoptysis No SOB No fever/chills No nausea No vomiting No abdominal pain No diarrhea No urinary symptoms No skin rash + fatigue No myalgias No headache Used OTC meds without relief  Allergies  Vibramycin  Home Medications   Prior to Admission medications   Medication Sig Start Date End Date Taking? Authorizing Provider  amLODipine-benazepril (LOTREL) 5-10 MG per capsule Take 1 capsule by mouth daily. 05/17/15   Sean Hommel, DO  amoxicillin (AMOXIL) 875 MG tablet Take 1 tablet (875 mg total) by mouth 2 (two) times daily. (Rx void after 06/02/15) 05/26/15   Lattie Haw, MD  aspirin 81 MG tablet Take 1 tablet (81 mg total) by mouth daily. When not taking daily NSAIDs 03/16/14    Sean Hommel, DO  atorvastatin (LIPITOR) 40 MG tablet Take 1 tablet (40 mg total) by mouth daily. 09/13/14   Sean Hommel, DO  benzonatate (TESSALON) 200 MG capsule Take 1 capsule (200 mg total) by mouth at bedtime. Take as needed for cough 05/26/15   Lattie Haw, MD  diazepam (VALIUM) 10 MG tablet Take 0.5-1 tablets (5-10 mg total) by mouth every 12 (twelve) hours as needed for anxiety. 01/12/15   Sean Hommel, DO  fluticasone (FLONASE) 50 MCG/ACT nasal spray Place 2 sprays into both nostrils daily. 05/02/14   Jade L Breeback, PA-C  gabapentin (NEURONTIN) 300 MG capsule Take 1 capsule (300 mg total) by mouth 3 (three) times daily. 05/30/14   Sean Hommel, DO  hydrochlorothiazide (HYDRODIURIL) 25 MG tablet TAKE ONE TABLET BY MOUTH ONCE DAILY IN THE MORNING FOR BLOOD PRESSURE CONTROL 05/17/15   Laren Boom, DO  SUMAtriptan (IMITREX) 50 MG tablet Take 1 tablet (50 mg total) by mouth every 2 (two) hours as needed for migraine or headache. May repeat in 2 hours if headache persists or recurs. 01/12/15   Sean Hommel, DO  zolpidem (AMBIEN) 5 MG tablet Take 1-2 tablets (5-10 mg total) by mouth at bedtime as needed for sleep. 05/02/15   Laren Boom, DO   Meds Ordered and Administered this Visit  Medications - No data to  display  BP 114/75 mmHg  Pulse 90  Temp(Src) 98.3 F (36.8 C) (Oral)  Ht  (1.626 m)  Wt 146 lb (66.225 kg)  BMI 25.05 kg/m2  SpO2 95% No data found.   Physical Exam Nursing notes and Vital Signs reviewed. Appearance:  Patient appears stated age, and in no acute distress Eyes:  Pupils are equal, round, and reactive to light and accomodation.  Extraocular movement is intact.  Conjunctivae are not inflamed  Ears:  Canals normal.  Tympanic membranes normal.  Nose:  Mildly congested turbinates.  No sinus tenderness.    Pharynx:  Uvula erythematous, otherwise normal Neck:  Supple.  Tender shotty tonsillar nodes.  Tender enlarged posterior nodes are palpated bilaterally  Lungs:  Clear to  auscultation.  Breath sounds are equal.  Moving air well. Heart:  Regular rate and rhythm without murmurs, rubs, or gallops.  Abdomen:  Nontender without masses or hepatosplenomegaly.  Bowel sounds are present.  No CVA or flank tenderness.  Extremities:  No edema.  No calf tenderness Skin:  No rash present.   ED Course  Procedures  None     Labs Reviewed  STREP A DNA PROBE negative  POCT RAPID STREP A (OFFICE)     MDM   1. Acute pharyngitis, unspecified etiology   2. Viral URI with cough    Throat culture pending.  Prescription written for Benzonatate [facility]) to take at bedtime for night-time cough.  Take plain guaifenesin (  extended release tabs such as Mucinex) twice daily, with plenty of water, for cough and congestion. Get adequate rest.   May use Afrin nasal spray (or generic oxymetazoline) twice daily for about 5 days and then discontinue.  Also recommend using saline nasal spray several times daily and saline nasal irrigation (AYR is a common brand).  May use Flonase nasal spray each morning after using Afrin nasal spray and saline nasal irrigation. Try warm salt water gargles for sore throat.  Stop all antihistamines for now, and other non-prescription cough/cold preparations. Begin Amoxicillin if not improving about one week or if persistent fever develops (Given a prescription to hold, with an expiration date)  Follow-up with family doctor if not improving about 2 weeks.   Lattie Haw, MD 05/26/15 [phone]

## 2015-05-27 ENCOUNTER — Telehealth: Payer: Self-pay | Admitting: Emergency Medicine

## 2015-05-27 LAB — STREP A DNA PROBE: GASP: NEGATIVE

## 2015-06-28 ENCOUNTER — Ambulatory Visit (INDEPENDENT_AMBULATORY_CARE_PROVIDER_SITE_OTHER): Payer: BLUE CROSS/BLUE SHIELD

## 2015-06-28 DIAGNOSIS — Z1231 Encounter for screening mammogram for malignant neoplasm of breast: Secondary | ICD-10-CM

## 2015-08-17 ENCOUNTER — Other Ambulatory Visit: Payer: Self-pay | Admitting: Family Medicine

## 2015-08-21 ENCOUNTER — Other Ambulatory Visit: Payer: Self-pay

## 2015-08-21 MED ORDER — ZOLPIDEM TARTRATE 5 MG PO TABS: 5.0000 mg | ORAL_TABLET | Freq: Every evening | ORAL | Status: DC | PRN

## 2015-08-21 NOTE — Telephone Encounter (Signed)
We will send a refill of ambien to your pharmacy later today.

## 2015-09-19 ENCOUNTER — Other Ambulatory Visit: Payer: Self-pay | Admitting: Family Medicine

## 2015-10-11 ENCOUNTER — Telehealth: Payer: Self-pay | Admitting: Family Medicine

## 2015-10-11 NOTE — Telephone Encounter (Signed)
Routed to PCP 

## 2015-10-12 MED ORDER — CEPHALEXIN 500 MG PO CAPS: 500.0000 mg | ORAL_CAPSULE | Freq: Three times a day (TID) | ORAL | Status: DC

## 2015-10-12 NOTE — Addendum Note (Signed)
Addended by: Laren Boom on: 10/12/2015 08:00 AM   Modules accepted: Orders, Medications

## 2015-10-12 NOTE — Telephone Encounter (Signed)
Attempted to contact Pt, no answer and VM was full.

## 2015-10-12 NOTE — Telephone Encounter (Signed)
Rebecca Berry, Will you please let patient know that I sent a refill of Cephalexin [facility].

## 2015-12-03 ENCOUNTER — Telehealth: Payer: Self-pay

## 2015-12-03 DIAGNOSIS — Z0000 Encounter for general adult medical examination without abnormal findings: Secondary | ICD-10-CM

## 2015-12-03 NOTE — Telephone Encounter (Signed)
Labs in your in box 

## 2015-12-03 NOTE — Telephone Encounter (Signed)
Pt.notified

## 2015-12-03 NOTE — Telephone Encounter (Signed)
Pt wants to get labs drawn tomorrow morning. Please advise.

## 2015-12-04 LAB — COMPLETE METABOLIC PANEL WITH GFR
ALT: 22 U/L (ref 6–29)
AST: 23 U/L (ref 10–35)
Albumin: 4 g/dL (ref 3.6–5.1)
Alkaline Phosphatase: 76 U/L (ref 33–130)
BILIRUBIN TOTAL: 1 mg/dL (ref 0.2–1.2)
BUN: 17 mg/dL (ref 7–25)
CHLORIDE: 106 mmol/L (ref 98–110)
CO2: 26 mmol/L (ref 20–31)
Calcium: 8.9 mg/dL (ref 8.6–10.4)
Creat: 0.66 mg/dL (ref 0.50–1.05)
Glucose, Bld: 87 mg/dL (ref 65–99)
Potassium: 4 mmol/L (ref 3.5–5.3)
Sodium: 141 mmol/L (ref 135–146)
Total Protein: 6.2 g/dL (ref 6.1–8.1)

## 2015-12-04 LAB — CBC
HEMATOCRIT: 37.3 % (ref 35.0–45.0)
Hemoglobin: 12.5 g/dL (ref 11.7–15.5)
MCH: 29.1 pg (ref 27.0–33.0)
MCHC: 33.5 g/dL (ref 32.0–36.0)
MCV: 86.7 fL (ref 80.0–100.0)
MPV: 10.4 fL (ref 7.5–12.5)
PLATELETS: 280 10*3/uL (ref 140–400)
RBC: 4.3 MIL/uL (ref 3.80–5.10)
RDW: 13.5 % (ref 11.0–15.0)
WBC: 5.3 10*3/uL (ref 3.8–10.8)

## 2015-12-04 LAB — LIPID PANEL
Cholesterol: 119 mg/dL — ABNORMAL LOW (ref 125–200)
HDL: 61 mg/dL (ref >=46)
LDL CALC: 46 mg/dL (ref <130)
Total CHOL/HDL Ratio: 2 Ratio (ref <=5.0)
Triglycerides: 61 mg/dL (ref <150)
VLDL: 12 mg/dL (ref <30)

## 2015-12-11 ENCOUNTER — Ambulatory Visit (INDEPENDENT_AMBULATORY_CARE_PROVIDER_SITE_OTHER): Payer: BLUE CROSS/BLUE SHIELD | Admitting: Family Medicine

## 2015-12-11 ENCOUNTER — Encounter: Payer: Self-pay | Admitting: Family Medicine

## 2015-12-11 VITALS — BP 104/61 | HR 74 | Wt 145.0 lb

## 2015-12-11 DIAGNOSIS — Z0000 Encounter for general adult medical examination without abnormal findings: Secondary | ICD-10-CM | POA: Diagnosis not present

## 2015-12-11 DIAGNOSIS — G4700 Insomnia, unspecified: Secondary | ICD-10-CM

## 2015-12-11 DIAGNOSIS — G43809 Other migraine, not intractable, without status migrainosus: Secondary | ICD-10-CM

## 2015-12-11 MED ORDER — ZOLPIDEM TARTRATE 5 MG PO TABS: 2.5000 mg | ORAL_TABLET | Freq: Every evening | ORAL | Status: DC | PRN

## 2015-12-11 MED ORDER — ATORVASTATIN CALCIUM 40 MG PO TABS: 40.0000 mg | ORAL_TABLET | Freq: Every day | ORAL | Status: DC

## 2015-12-11 NOTE — Progress Notes (Signed)
CC: Rebecca Berry is a 55 y.o. female is here for Annual Exam   Subjective: HPI:  Colonoscopy: Colonoscopy in 2013 for bleeding which was unremarkable other than hemorrhoids Papsmear: Normal October 2015 Mammogram: Normal November 2016  Influenza Vaccine: out of season Pneumovax: no current indication Td/Tdap: 05/2013 UTD Zoster: (Start 55 yo)   Requesting complete physical exam with no acute complaints other than requesting a refill of Ambien that she is taking most nights out of the week and believes it still helping. No known side effects  Review of Systems - General ROS: negative for - chills, fever, night sweats, weight gain or weight loss Ophthalmic ROS: negative for - decreased vision Psychological ROS: negative for - anxiety or depression ENT ROS: negative for - hearing change, nasal congestion, tinnitus or allergies Hematological and Lymphatic ROS: negative for - bleeding problems, bruising or swollen lymph nodes Breast ROS: negative Respiratory ROS: no cough, shortness of breath, or wheezing Cardiovascular ROS: no chest pain or dyspnea on exertion Gastrointestinal ROS: no abdominal pain, change in bowel habits, or black or bloody stools Genito-Urinary ROS: negative for - genital discharge, genital ulcers, incontinence or abnormal bleeding from genitals Musculoskeletal ROS: negative for - joint pain or muscle pain Neurological ROS: negative for - headaches or memory loss Dermatological ROS: negative for lumps, mole changes, rash and skin lesion changes  Past Medical History  Diagnosis Date  . Pacemaker syndrome   . Hypertension   . Hyperlipidemia     Past Surgical History  Procedure Laterality Date  . Tonsillectomy and adenoidectomy  1962  . Breast biopsy  12/1997  . Cholecystectomy  05/2003  . Biceps tendon repair  08/2007   Family History  Problem Relation Age of Onset  . Alcoholism Father   . Bladder Cancer Father   . Lung cancer Mother   . Heart  attack Father   . Hypertension Father   . Leukemia Paternal Grandmother     Social History   Social History  . Marital Status: Married    Spouse Name: N/A  . Number of Children: N/A  . Years of Education: N/A   Occupational History  . Not on file.   Social History Main Topics  . Smoking status: Former Smoker    Quit date: 03/26/2012  . Smokeless tobacco: Not on file  . Alcohol Use: No  . Drug Use: No  . Sexual Activity:    Partners: Male   Other Topics Concern  . Not on file   Social History Narrative     Objective: BP 104/61 mmHg  Pulse 74  Wt 145 lb (65.772 kg)  General: No Acute Distress HEENT: Atraumatic, normocephalic, conjunctivae normal without scleral icterus.  No nasal discharge, hearing grossly intact, TMs with good landmarks bilaterally with no middle ear abnormalities, posterior pharynx clear without oral lesions. Neck: Supple, trachea midline, no cervical nor supraclavicular adenopathy. Pulmonary: Clear to auscultation bilaterally without wheezing, rhonchi, nor rales. Cardiac: Regular rate and rhythm.  No murmurs, rubs, nor gallops. No peripheral edema.  2+ peripheral pulses bilaterally. Abdomen: Bowel sounds normal.  No masses.  Non-tender without rebound.  Negative Murphy's sign. MSK: Grossly intact, no signs of weakness.  Full strength throughout upper and lower extremities.  Full ROM in upper and lower extremities.  No midline spinal tenderness. Neuro: Gait unremarkable, CN II-XII grossly intact.  C5-C6 Reflex 2/4 Bilaterally, L4 Reflex 2/4 Bilaterally.  Cerebellar function intact. Skin: No rashes. Psych: Alert and oriented to person/place/time.  Thought process normal. No  anxiety/depression.   Assessment & Plan: Rebecca LeatherwoodKatherine was seen today for annual exam.  Diagnoses and all orders for this visit:  Annual physical exam  Other migraine without status migrainosus, not intractable  Insomnia  Other orders -     zolpidem (AMBIEN) 5 MG tablet; Take  0.5-1 tablets (2.5-5 mg total) by mouth at bedtime as needed for sleep. -     atorvastatin (LIPITOR) 40 MG tablet; Take 1 tablet (40 mg total) by mouth daily.   Healthy lifestyle interventions including but not limited to regular exercise, a healthy low fat diet, moderation of salt intake, the dangers of tobacco/alcohol/recreational drug use, nutrition supplementation, and accident avoidance were discussed with the patient and a handout was provided for future reference.  Return in about 6 months (around 06/11/2016) for BP follow up.

## 2016-01-11 ENCOUNTER — Telehealth: Payer: Self-pay | Admitting: Family Medicine

## 2016-01-14 MED ORDER — CEPHALEXIN 500 MG PO CAPS: 500.0000 mg | ORAL_CAPSULE | Freq: Three times a day (TID) | ORAL | Status: DC

## 2016-01-14 MED ORDER — DIAZEPAM 10 MG PO TABS: 5.0000 mg | ORAL_TABLET | Freq: Two times a day (BID) | ORAL | Status: DC | PRN

## 2016-01-14 NOTE — Telephone Encounter (Signed)
Rebecca Berry, Rx placed in in-box ready for pickup/faxing.  

## 2016-01-14 NOTE — Telephone Encounter (Signed)
Rx has been faxed to patient pharmacy. [place] Espina,CMA  

## 2016-01-14 NOTE — Telephone Encounter (Signed)
Routed to PCP for review regarding request for new Rx.

## 2016-02-24 ENCOUNTER — Emergency Department (INDEPENDENT_AMBULATORY_CARE_PROVIDER_SITE_OTHER)
Admission: EM | Admit: 2016-02-24 | Discharge: 2016-02-24 | Disposition: A | Discharge status: Home / Self Care | Payer: BLUE CROSS/BLUE SHIELD | Source: Home / Self Care | Attending: Family Medicine | Admitting: Family Medicine

## 2016-02-24 ENCOUNTER — Encounter: Payer: Self-pay | Admitting: Emergency Medicine

## 2016-02-24 DIAGNOSIS — J069 Acute upper respiratory infection, unspecified: Secondary | ICD-10-CM | POA: Diagnosis not present

## 2016-02-24 DIAGNOSIS — H6982 Other specified disorders of Eustachian tube, left ear: Secondary | ICD-10-CM | POA: Diagnosis not present

## 2016-02-24 MED ORDER — PREDNISONE 20 MG PO TABS: ORAL_TABLET | ORAL | Status: DC

## 2016-02-24 MED ORDER — AZITHROMYCIN 250 MG PO TABS: 250.0000 mg | ORAL_TABLET | Freq: Every day | ORAL | Status: DC

## 2016-02-24 MED ORDER — FLUTICASONE PROPIONATE 50 MCG/ACT NA SUSP: 2.0000 | Freq: Every day | NASAL | Status: DC

## 2016-02-24 MED ORDER — BENZONATATE 100 MG PO CAPS: 100.0000 mg | ORAL_CAPSULE | Freq: Three times a day (TID) | ORAL | Status: DC

## 2016-02-24 NOTE — ED Provider Notes (Signed)
CSN: [id number]     Arrival date & time 02/24/16  1121 History   First MD Initiated Contact with Patient 02/24/16 1145     Chief Complaint  Patient presents with  . Cough   (Consider location/radiation/quality/duration/timing/severity/associated sxs/prior Treatment) HPI  Rebecca Berry is a 55 y.o. female presenting to UC with c/o 3 days of worsening moderately productive cough and bilateral ear fullness with Right sided ear pain. She reports hx of URI symptoms about 2 months ago and notes she feels like she never got completely better. Pt concerned congestion keeps collecting in her lungs.  Mild nasal congestion and sinus pressure. Denies fever, chills, n/v/d but notes she "never" gets a fever. No sick contacts or recent travel.    Past Medical History  Diagnosis Date  . Pacemaker syndrome   . Hypertension   . Hyperlipidemia    Past Surgical History  Procedure Laterality Date  . Tonsillectomy and adenoidectomy  1962  . Breast biopsy  12/1997  . Cholecystectomy  05/2003  . Biceps tendon repair  08/2007   Family History  Problem Relation Age of Onset  . Alcoholism Father   . Bladder Cancer Father   . Lung cancer Mother   . Heart attack Father   . Hypertension Father   . Leukemia Paternal Grandmother    Social History  Substance Use Topics  . Smoking status: Former Smoker    Quit date: 03/26/2012  . Smokeless tobacco: None  . Alcohol Use: No   OB History    No data available     Review of Systems  Constitutional: Negative for fever and chills.  HENT: Positive for congestion, ear pain and sinus pressure. Negative for ear discharge, sore throat, trouble swallowing and voice change.   Respiratory: Positive for cough. Negative for shortness of breath and wheezing.   Cardiovascular: Negative for chest pain and palpitations.  Gastrointestinal: Negative for nausea, vomiting, abdominal pain and diarrhea.  Musculoskeletal: Negative for myalgias, back pain and arthralgias.   Skin: Negative for rash.  Neurological: Negative for dizziness, light-headedness and headaches.    Allergies  Vibramycin  Home Medications   Prior to Admission medications   Medication Sig Start Date End Date Taking? Authorizing Provider  amLODipine-benazepril (LOTREL) 5-10 MG per capsule Take 1 capsule by mouth daily. 05/17/15   Laren BoomSean Hommel, DO  aspirin 81 MG tablet Take 1 tablet (81 mg total) by mouth daily. When not taking daily NSAIDs 03/16/14   Sean Hommel, DO  atorvastatin (LIPITOR) 40 MG tablet Take 1 tablet (40 mg total) by mouth daily. 12/11/15   Sean Hommel, DO  azithromycin (ZITHROMAX) 250 MG tablet Take 1 tablet (250 mg total) by mouth daily. Take first 2 tablets together, then 1 every day until finished. 02/24/16   Junius FinnerErin O'Malley, PA-C  benzonatate (TESSALON) 100 MG capsule Take 1 capsule (100 mg total) by mouth every 8 (eight) hours. 02/24/16   Junius FinnerErin O'Malley, PA-C  cephALEXin (KEFLEX) 500 MG capsule Take 1 capsule (500 mg total) by mouth 3 (three) times daily. For staph breakouts. 01/14/16   Sean Hommel, DO  diazepam (VALIUM) 10 MG tablet Take 0.5-1 tablets (5-10 mg total) by mouth every 12 (twelve) hours as needed for anxiety. 01/14/16   Sean Hommel, DO  fluticasone (FLONASE) 50 MCG/ACT nasal spray Place 2 sprays into both nostrils daily. 02/24/16   Junius FinnerErin O'Malley, PA-C  hydrochlorothiazide (HYDRODIURIL) 25 MG tablet TAKE ONE TABLET BY MOUTH ONCE DAILY IN THE MORNING FOR BLOOD PRESSURE CONTROL 05/17/15  Laren BoomSean Hommel, DO  predniSONE (DELTASONE) 20 MG tablet 3 tabs po day one, then 2 po daily x 4 days 02/24/16   Junius FinnerErin O'Malley, PA-C  SUMAtriptan (IMITREX) 50 MG tablet Take 1 tablet (50 mg total) by mouth every 2 (two) hours as needed for migraine or headache. May repeat in 2 hours if headache persists or recurs. 01/12/15   Sean Hommel, DO  zolpidem (AMBIEN) 5 MG tablet Take 0.5-1 tablets (2.5-5 mg total) by mouth at bedtime as needed for sleep. 12/11/15   Laren BoomSean Hommel, DO   Meds Ordered and  Administered this Visit  Medications - No data to display  BP 110/66 mmHg  Pulse 79  Temp(Src) 98.1 F (36.7 C) (Oral)  Ht 5\' 4"  (1.626 m)  Wt 150 lb (68.04 kg)  BMI 25.73 kg/m2  SpO2 95% No data found.   Physical Exam  Constitutional: She appears well-developed and well-nourished. No distress.  HENT:  Head: Normocephalic and atraumatic.  Right Ear: Tympanic membrane is erythematous. Tympanic membrane is not bulging. A middle ear effusion is present.  Left Ear: Tympanic membrane normal.  Nose: Mucosal edema present. Right sinus exhibits no maxillary sinus tenderness and no frontal sinus tenderness. Left sinus exhibits no maxillary sinus tenderness and no frontal sinus tenderness.  Mouth/Throat: Uvula is midline, oropharynx is clear and moist and mucous membranes are normal.  Eyes: Conjunctivae are normal. No scleral icterus.  Neck: Normal range of motion.  Cardiovascular: Normal rate, regular rhythm and normal heart sounds.   Pulmonary/Chest: Effort normal. No respiratory distress. She has no wheezes. She has rhonchi ( faint in upper lung fields, clears with cough). She has no rales.  intermittent productive cough on exam. No respiratory distress.  Abdominal: Soft. She exhibits no distension. There is no tenderness.  Musculoskeletal: Normal range of motion.  Neurological: She is alert.  Skin: Skin is warm and dry. She is not diaphoretic.  Nursing note and vitals reviewed.   ED Course  Procedures (including critical care time)  Labs Review Labs Reviewed - No data to display  Imaging Review No results found.  Tympanometry: Right ear- Normal, Left ear- low peak height, normal ear volume  MDM   1. Acute upper respiratory infection   2. Eustachian tube dysfunction, left    Pt c/o worsening URI symptoms and bilateral ear fullness. Reports URI symptoms about 2 months ago, concerned she never fully recovered from that.  Will cover for bacterial cause of URI and, possible  early ear infection as well.  Rx: Prednisone, flonase, tessalon and azithromycin   Advised pt to use acetaminophen and ibuprofen as needed for fever and pain. Encouraged rest and fluids. F/u with PCP in 7-10 days if not improving, sooner if worsening. Pt verbalized understanding and agreement with tx plan.      Junius Finnerrin O'Malley, PA-C 02/24/16 [phone]

## 2016-02-24 NOTE — Discharge Instructions (Signed)
You may take 400-600mg Ibuprofen (Motrin) every 6-8 hours for fever and pain  °Alternate with Tylenol  °You may take 500mg Tylenol every 4-6 hours as needed for fever and pain  °Follow-up with your primary care provider next week for recheck of symptoms if not improving.  °Be sure to drink plenty of fluids and rest, at least 8hrs of sleep a night, preferably more while you are sick. °Return urgent care or go to closest ER if you cannot keep down fluids/signs of dehydration, fever not reducing with Tylenol, difficulty breathing/wheezing, stiff neck, worsening condition, or other concerns (see below)  °Please take antibiotics as prescribed and be sure to complete entire course even if you start to feel better to ensure infection does not come back. ° ° °Cool Mist Vaporizers °Vaporizers may help relieve the symptoms of a cough and cold. They add moisture to the air, which helps mucus to become thinner and less sticky. This makes it easier to breathe and cough up secretions. Cool mist vaporizers do not cause serious burns like hot mist vaporizers, which may also be called steamers or humidifiers. Vaporizers have not been proven to help with colds. You should not use a vaporizer if you are allergic to mold. °HOME CARE INSTRUCTIONS °· Follow the package instructions for the vaporizer. °· Do not use anything other than distilled water in the vaporizer. °· Do not run the vaporizer all of the time. This can cause mold or bacteria to grow in the vaporizer. °· Clean the vaporizer after each time it is used. °· Clean and dry the vaporizer well before storing it. °· Stop using the vaporizer if worsening respiratory symptoms develop. °  °This information is not intended to replace advice given to you by your health care provider. Make sure you discuss any questions you have with your health care provider. °  °Document Released: 05/08/2004 Document Revised: 08/16/2013 Document Reviewed: 12/29/2012 °Elsevier Interactive Patient  Education ©2016 [employer] ° °

## 2016-02-24 NOTE — ED Notes (Signed)
Pt c/o productive cough on Friday, right ear pain, chest congestion, no runny nose.

## 2016-02-28 ENCOUNTER — Other Ambulatory Visit: Payer: Self-pay | Admitting: Family Medicine

## 2016-03-13 ENCOUNTER — Other Ambulatory Visit: Payer: Self-pay | Admitting: Family Medicine

## 2016-03-18 ENCOUNTER — Ambulatory Visit (INDEPENDENT_AMBULATORY_CARE_PROVIDER_SITE_OTHER): Payer: BLUE CROSS/BLUE SHIELD | Admitting: Family Medicine

## 2016-03-18 ENCOUNTER — Encounter: Payer: Self-pay | Admitting: Family Medicine

## 2016-03-18 VITALS — BP 106/70 | HR 87 | Wt 148.0 lb

## 2016-03-18 DIAGNOSIS — R1310 Dysphagia, unspecified: Secondary | ICD-10-CM | POA: Diagnosis not present

## 2016-03-18 DIAGNOSIS — I10 Essential (primary) hypertension: Secondary | ICD-10-CM

## 2016-03-18 DIAGNOSIS — F418 Other specified anxiety disorders: Secondary | ICD-10-CM | POA: Diagnosis not present

## 2016-03-18 MED ORDER — DIAZEPAM 10 MG PO TABS: 5.0000 mg | ORAL_TABLET | Freq: Two times a day (BID) | ORAL | 1 refills | Status: DC | PRN

## 2016-03-18 MED ORDER — LIDOCAINE VISCOUS 2 % MT SOLN: 15.0000 mL | OROMUCOSAL | 0 refills | Status: DC | PRN
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
## 2016-03-24 ENCOUNTER — Other Ambulatory Visit: Payer: Self-pay | Admitting: Family Medicine

## 2016-05-26 ENCOUNTER — Other Ambulatory Visit: Payer: Self-pay | Admitting: Family Medicine

## 2016-06-06 ENCOUNTER — Other Ambulatory Visit: Payer: Self-pay | Admitting: Physician Assistant

## 2016-06-06 DIAGNOSIS — Z1231 Encounter for screening mammogram for malignant neoplasm of breast: Secondary | ICD-10-CM

## 2016-06-11 ENCOUNTER — Other Ambulatory Visit: Payer: Self-pay | Admitting: Family Medicine

## 2016-06-13 ENCOUNTER — Other Ambulatory Visit: Payer: Self-pay | Admitting: *Deleted

## 2016-06-13 MED ORDER — AMLODIPINE BESY-BENAZEPRIL HCL 5-10 MG PO CAPS: 1.0000 | ORAL_CAPSULE | Freq: Every day | ORAL | 1 refills | Status: DC

## 2016-06-18 ENCOUNTER — Other Ambulatory Visit (HOSPITAL_COMMUNITY)
Admission: RE | Admit: 2016-06-18 | Discharge: 2016-06-18 | Disposition: A | Discharge status: Home / Self Care | Payer: BLUE CROSS/BLUE SHIELD | Source: Ambulatory Visit | Attending: Physician Assistant | Admitting: Physician Assistant

## 2016-06-18 ENCOUNTER — Ambulatory Visit (INDEPENDENT_AMBULATORY_CARE_PROVIDER_SITE_OTHER): Payer: BLUE CROSS/BLUE SHIELD | Admitting: Physician Assistant

## 2016-06-18 ENCOUNTER — Encounter: Payer: Self-pay | Admitting: Physician Assistant

## 2016-06-18 VITALS — BP 121/49 | HR 81 | Ht 64.0 in | Wt 153.0 lb

## 2016-06-18 DIAGNOSIS — I498 Other specified cardiac arrhythmias: Secondary | ICD-10-CM

## 2016-06-18 DIAGNOSIS — Z01419 Encounter for gynecological examination (general) (routine) without abnormal findings: Secondary | ICD-10-CM | POA: Insufficient documentation

## 2016-06-18 DIAGNOSIS — Z124 Encounter for screening for malignant neoplasm of cervix: Secondary | ICD-10-CM

## 2016-06-18 DIAGNOSIS — I10 Essential (primary) hypertension: Secondary | ICD-10-CM

## 2016-06-18 DIAGNOSIS — Z1151 Encounter for screening for human papillomavirus (HPV): Secondary | ICD-10-CM | POA: Insufficient documentation

## 2016-06-18 MED ORDER — BENAZEPRIL HCL 10 MG PO TABS: 10.0000 mg | ORAL_TABLET | Freq: Every day | ORAL | 3 refills | Status: DC

## 2016-06-18 MED ORDER — ZOLPIDEM TARTRATE 5 MG PO TABS: ORAL_TABLET | ORAL | 5 refills | Status: DC

## 2016-06-18 NOTE — Progress Notes (Signed)
Subjective:     Rebecca Berry is a 55 y.o. woman who comes in today for a  pap smear only. Her most recent annual exam was on 11/2015. Her most recent Pap smear was on 06/12/16 and showed no abnormalities. Previous abnormal Pap smears: no. Contraception: post menopausal status  The following portions of the patient's history were reviewed and updated as appropriate: allergies, current medications, past family history, past medical history, past social history, past surgical history and problem list.  Review of Systems A comprehensive review of systems was negative.   Objective:    BP (!) 121/49   Pulse 81   Ht 5\' 4"  (1.626 m)   Wt 153 lb (69.4 kg)   BMI 26.26 kg/m  Pelvic Exam: cervix normal in appearance, external genitalia normal, no adnexal masses or tenderness, no bladder tenderness and vagina normal without discharge. Pap smear obtained.   Assessment:    Screening pap smear.   Plan:    Follow up in 1 year for bimanuel exam , or as indicated by Pap results  HTN/wandering pacemaker- BP low today will stop norvasc component of BP medications. Will keep on benazpril 10mg  daily.keep monitoring BP to stay under 140/90. If having any CP, palpitations, dizziness let me know. We may need to consider stress if having any symptoms due to atherosclerosis  INsomnia- doing well ambien refilled.

## 2016-06-18 NOTE — Addendum Note (Signed)
Addended by: Donne AnonBENDER, Shealynn Saulnier L on: 06/18/2016 09:13 AM   Modules accepted: Orders

## 2016-06-19 LAB — CYTOLOGY - PAP
Diagnosis: NEGATIVE
HPV: NOT DETECTED

## 2016-07-04 ENCOUNTER — Ambulatory Visit (INDEPENDENT_AMBULATORY_CARE_PROVIDER_SITE_OTHER): Payer: BLUE CROSS/BLUE SHIELD

## 2016-07-04 DIAGNOSIS — Z1231 Encounter for screening mammogram for malignant neoplasm of breast: Secondary | ICD-10-CM | POA: Diagnosis not present

## 2016-07-08 NOTE — Progress Notes (Signed)
Normal mammogram. Follow up in one year.

## 2016-07-31 ENCOUNTER — Encounter: Payer: Self-pay | Admitting: *Deleted

## 2016-07-31 ENCOUNTER — Emergency Department (INDEPENDENT_AMBULATORY_CARE_PROVIDER_SITE_OTHER)
Admission: EM | Admit: 2016-07-31 | Discharge: 2016-07-31 | Disposition: A | Discharge status: Home / Self Care | Payer: BLUE CROSS/BLUE SHIELD | Source: Home / Self Care | Attending: Family Medicine | Admitting: Family Medicine

## 2016-07-31 DIAGNOSIS — J069 Acute upper respiratory infection, unspecified: Secondary | ICD-10-CM | POA: Diagnosis not present

## 2016-07-31 DIAGNOSIS — B9789 Other viral agents as the cause of diseases classified elsewhere: Secondary | ICD-10-CM

## 2016-07-31 DIAGNOSIS — L989 Disorder of the skin and subcutaneous tissue, unspecified: Secondary | ICD-10-CM

## 2016-07-31 MED ORDER — BENZONATATE 100 MG PO CAPS: 100.0000 mg | ORAL_CAPSULE | Freq: Three times a day (TID) | ORAL | 0 refills | Status: DC

## 2016-07-31 NOTE — ED Triage Notes (Signed)
Patient c/o 2 days of nasal congestion. Awoke today with productive cough and facial pain.  Also c/o sore in right axilla. H/o MRSA. She has Rx for Cephalexin. Her grandson who is sick is coming to visit, wants to ensure she wont pass anything to him.

## 2016-07-31 NOTE — ED Provider Notes (Signed)
CSN: [id number]     Arrival date & time 07/31/16  0804 History   First MD Initiated Contact with Patient 07/31/16 0820     Chief Complaint  Patient presents with  . Facial Pain  . Cough  . Sore    right axilla   (Consider location/radiation/quality/duration/timing/severity/associated sxs/prior Treatment) HPI Rebecca Berry is a 55 y.o. female presenting to UC with c/o facial pain, nasal congestion and mild intermittent productive cough with yellow sputum for 2 days.  She also reports having a sore under her Right axilla.  Hx of recurrent staph infections; current sore is similar to early onset of her skin infections.  She notes she has cephalexin at home, prescribed by her PCP due to frequency of recurrent skin infections.  She is wondering if the cephalexin would cover for there URI symptoms.  She notes her 7018 month old grandson may come visit this weekend but is currently in the hospital.  She wants to make sure she does not get her grandson sick.     Past Medical History:  Diagnosis Date  . Hyperlipidemia   . Hypertension   . Pacemaker syndrome    Past Surgical History:  Procedure Laterality Date  . BICEPS TENDON REPAIR  08/2007  . BREAST BIOPSY  12/1997  . CHOLECYSTECTOMY  05/2003  . TONSILLECTOMY AND ADENOIDECTOMY  1962   Family History  Problem Relation Age of Onset  . Alcoholism Father   . Bladder Cancer Father   . Heart attack Father   . Hypertension Father   . Lung cancer Mother   . Leukemia Paternal Grandmother    Social History  Substance Use Topics  . Smoking status: Former Smoker    Quit date: 03/26/2012  . Smokeless tobacco: Never Used  . Alcohol use No   OB History    No data available     Review of Systems  Constitutional: Negative for chills and fever.  HENT: Positive for congestion, postnasal drip, rhinorrhea, sinus pain, sinus pressure and sore throat. Negative for ear pain, trouble swallowing and voice change.   Respiratory: Positive for cough.  Negative for shortness of breath.   Cardiovascular: Negative for chest pain and palpitations.  Gastrointestinal: Negative for abdominal pain, diarrhea, nausea and vomiting.  Musculoskeletal: Negative for arthralgias, back pain and myalgias.  Skin: Negative for rash.  Neurological: Negative for dizziness, light-headedness and headaches.    Allergies  Vibramycin [doxycycline calcium]  Home Medications   Prior to Admission medications   Medication Sig Start Date End Date Taking? Authorizing Provider  atorvastatin (LIPITOR) 40 MG tablet Take 1 tablet (40 mg total) by mouth daily. 12/11/15  Yes Sean Hommel, DO  benazepril (LOTENSIN) 10 MG tablet Take 1 tablet (10 mg total) by mouth daily. 06/18/16  Yes Jade L Breeback, PA-C  CEPHALEXIN PO Take by mouth.   Yes Historical Provider, MD  diazepam (VALIUM) 10 MG tablet Take 0.5-1 tablets (5-10 mg total) by mouth every 12 (twelve) hours as needed for anxiety. 03/18/16  Yes Sean Hommel, DO  SUMAtriptan (IMITREX) 50 MG tablet TAKE ONE TABLET BY MOUTH EVERY 2 HOURS AS NEEDED FOR MIGRAINE OR HEADACHE. MAY REPEAT IN 2 HOURS IF HEADACHE PERSISTS OR RECURS 03/24/16  Yes Sean Hommel, DO  zolpidem (AMBIEN) 5 MG tablet Take one-half to one tablet by mouth at bedtime as needed for sleep. 06/18/16  Yes Jade L Breeback, PA-C  benzonatate (TESSALON) 100 MG capsule Take 1-2 capsules (100-200 mg total) by mouth every 8 (eight) hours. 07/31/16  Junius FinnerErin O'Malley, PA-C   Meds Ordered and Administered this Visit  Medications - No data to display  BP 138/90 (BP Location: Left Arm)   Pulse 90   Temp 98.1 F (36.7 C) (Oral)   Resp 16   Wt 153 lb (69.4 kg)   SpO2 95%   BMI 26.26 kg/m  No data found.   Physical Exam  Constitutional: She appears well-developed and well-nourished. No distress.  HENT:  Head: Normocephalic and atraumatic.  Right Ear: Tympanic membrane normal.  Left Ear: Tympanic membrane normal.  Nose: Nose normal. Right sinus exhibits no maxillary  sinus tenderness and no frontal sinus tenderness. Left sinus exhibits no maxillary sinus tenderness and no frontal sinus tenderness.  Mouth/Throat: Uvula is midline, oropharynx is clear and moist and mucous membranes are normal.  Eyes: Conjunctivae are normal. No scleral icterus.  Neck: Normal range of motion. Neck supple.  Cardiovascular: Normal rate, regular rhythm and normal heart sounds.   Pulmonary/Chest: Effort normal and breath sounds normal. No stridor. No respiratory distress. She has no wheezes. She has no rales.  Abdominal: Soft. She exhibits no distension. There is no tenderness.  Musculoskeletal: Normal range of motion.  Lymphadenopathy:    She has no cervical adenopathy.  Neurological: She is alert.  Skin: Skin is warm and dry. She is not diaphoretic. There is erythema.  Right axilla: 0.5cm circular area of erythema. Mildly tender. No bleeding or discharge. No red streaking.   Nursing note and vitals reviewed.   Urgent Care Course   Clinical Course     Procedures (including critical care time)  Labs Review Labs Reviewed - No data to display  Imaging Review No results found.   MDM   1. Viral URI with cough   2. Skin sore    Pt c/o 2 days of URI symptoms. No evidence of bacterial infection with URI at this time.   Skin sore c/w early onset cellulitis vs abscess. Pt may take her keflex as prescribed for skin infections. It could cover for bacterial URI infections, however, advised pt, URI symptoms are likely viral in nature, thus the Keflex may not help with symptoms. Encouraged to keep taking OTC mucinex. Rx: tessalon F/u with PCP if not improving in 1 week. Patient verbalized understanding and agreement with treatment plan.    Junius FinnerErin O'Malley, PA-C 07/31/16 [phone]

## 2016-08-19 ENCOUNTER — Other Ambulatory Visit: Payer: Self-pay | Admitting: *Deleted

## 2016-08-19 MED ORDER — SUMATRIPTAN SUCCINATE 50 MG PO TABS: ORAL_TABLET | ORAL | 0 refills | Status: DC

## 2016-11-28 ENCOUNTER — Other Ambulatory Visit: Payer: Self-pay | Admitting: Physician Assistant

## 2016-12-03 ENCOUNTER — Telehealth: Payer: Self-pay | Admitting: Physician Assistant

## 2016-12-03 DIAGNOSIS — Z13220 Encounter for screening for lipoid disorders: Secondary | ICD-10-CM

## 2016-12-03 DIAGNOSIS — Z0000 Encounter for general adult medical examination without abnormal findings: Secondary | ICD-10-CM

## 2016-12-03 DIAGNOSIS — Z131 Encounter for screening for diabetes mellitus: Secondary | ICD-10-CM

## 2016-12-03 NOTE — Telephone Encounter (Signed)
I ordered the basics. Please fax downstairs. If she feels like she wants something else ordered let me know.

## 2016-12-03 NOTE — Telephone Encounter (Signed)
She would like a lab created so that she can pick it up on Wednesday 12/10/16 so that Lesly Rubenstein can have the lab results for her visit that is on 12/17/16.

## 2016-12-10 LAB — CBC WITH DIFFERENTIAL/PLATELET
Basophils Absolute: 0 cells/uL (ref 0–200)
Basophils Relative: 0 %
EOS ABS: 112 {cells}/uL (ref 15–500)
Eosinophils Relative: 2 %
HEMATOCRIT: 37.3 % (ref 35.0–45.0)
HEMOGLOBIN: 12.4 g/dL (ref 11.7–15.5)
Lymphocytes Relative: 34 %
Lymphs Abs: 1904 cells/uL (ref 850–3900)
MCH: 28.6 pg (ref 27.0–33.0)
MCHC: 33.2 g/dL (ref 32.0–36.0)
MCV: 86.1 fL (ref 80.0–100.0)
MONO ABS: 336 {cells}/uL (ref 200–950)
MPV: 11.3 fL (ref 7.5–12.5)
Monocytes Relative: 6 %
NEUTROS ABS: 3248 {cells}/uL (ref 1500–7800)
Neutrophils Relative %: 58 %
Platelets: 283 10*3/uL (ref 140–400)
RBC: 4.33 MIL/uL (ref 3.80–5.10)
RDW: 13.9 % (ref 11.0–15.0)
WBC: 5.6 10*3/uL (ref 3.8–10.8)

## 2016-12-10 LAB — COMPLETE METABOLIC PANEL WITH GFR
ALT: 21 U/L (ref 6–29)
AST: 20 U/L (ref 10–35)
Albumin: 4.1 g/dL (ref 3.6–5.1)
Alkaline Phosphatase: 90 U/L (ref 33–130)
BUN: 18 mg/dL (ref 7–25)
CHLORIDE: 106 mmol/L (ref 98–110)
CO2: 24 mmol/L (ref 20–31)
Calcium: 9.3 mg/dL (ref 8.6–10.4)
Creat: 0.79 mg/dL (ref 0.50–1.05)
GFR, EST NON AFRICAN AMERICAN: 84 mL/min (ref >=60)
Glucose, Bld: 83 mg/dL (ref 65–99)
Potassium: 4.2 mmol/L (ref 3.5–5.3)
SODIUM: 143 mmol/L (ref 135–146)
Total Bilirubin: 1.4 mg/dL — ABNORMAL HIGH (ref 0.2–1.2)
Total Protein: 6.5 g/dL (ref 6.1–8.1)

## 2016-12-10 LAB — LIPID PANEL
Cholesterol: 137 mg/dL (ref <200)
HDL: 65 mg/dL (ref >50)
LDL CALC: 57 mg/dL (ref <100)
TRIGLYCERIDES: 76 mg/dL (ref <150)
Total CHOL/HDL Ratio: 2.1 Ratio (ref <5.0)
VLDL: 15 mg/dL (ref <30)

## 2016-12-11 NOTE — Telephone Encounter (Signed)
Call pt: cholesterol is fantastic. Kidney, liver, glucose look great.

## 2016-12-17 ENCOUNTER — Ambulatory Visit (INDEPENDENT_AMBULATORY_CARE_PROVIDER_SITE_OTHER): Payer: BLUE CROSS/BLUE SHIELD | Admitting: Physician Assistant

## 2016-12-17 ENCOUNTER — Encounter: Payer: Self-pay | Admitting: Physician Assistant

## 2016-12-17 ENCOUNTER — Ambulatory Visit (INDEPENDENT_AMBULATORY_CARE_PROVIDER_SITE_OTHER): Payer: BLUE CROSS/BLUE SHIELD

## 2016-12-17 VITALS — BP 132/84 | HR 91 | Ht 64.0 in | Wt 152.0 lb

## 2016-12-17 DIAGNOSIS — F5101 Primary insomnia: Secondary | ICD-10-CM | POA: Diagnosis not present

## 2016-12-17 DIAGNOSIS — I498 Other specified cardiac arrhythmias: Secondary | ICD-10-CM

## 2016-12-17 DIAGNOSIS — Z87891 Personal history of nicotine dependence: Secondary | ICD-10-CM

## 2016-12-17 DIAGNOSIS — I10 Essential (primary) hypertension: Secondary | ICD-10-CM | POA: Diagnosis not present

## 2016-12-17 DIAGNOSIS — Z0000 Encounter for general adult medical examination without abnormal findings: Secondary | ICD-10-CM | POA: Diagnosis not present

## 2016-12-17 DIAGNOSIS — Z872 Personal history of diseases of the skin and subcutaneous tissue: Secondary | ICD-10-CM

## 2016-12-17 DIAGNOSIS — I700 Atherosclerosis of aorta: Secondary | ICD-10-CM | POA: Diagnosis not present

## 2016-12-17 DIAGNOSIS — G43009 Migraine without aura, not intractable, without status migrainosus: Secondary | ICD-10-CM | POA: Diagnosis not present

## 2016-12-17 MED ORDER — ZOLPIDEM TARTRATE 5 MG PO TABS: ORAL_TABLET | ORAL | 5 refills | Status: DC

## 2016-12-17 MED ORDER — BENAZEPRIL HCL 10 MG PO TABS: 10.0000 mg | ORAL_TABLET | Freq: Every day | ORAL | 3 refills | Status: DC

## 2016-12-17 MED ORDER — CEPHALEXIN 500 MG PO CAPS: 500.0000 mg | ORAL_CAPSULE | Freq: Two times a day (BID) | ORAL | 0 refills | Status: DC

## 2016-12-17 MED ORDER — ATORVASTATIN CALCIUM 40 MG PO TABS: 40.0000 mg | ORAL_TABLET | Freq: Every day | ORAL | 3 refills | Status: DC

## 2016-12-17 MED ORDER — SUMATRIPTAN SUCCINATE 50 MG PO TABS: ORAL_TABLET | ORAL | 5 refills | Status: DC

## 2016-12-17 NOTE — Patient Instructions (Addendum)
mucinex  twice a day for 3 days with zyrtec .  Will get stress test.  Will get carotid dopplers.   Keeping You Healthy  Get These Tests  Blood Pressure- Have your blood pressure checked by your healthcare provider at least once a year.  Normal blood pressure is 120/80.  Weight- Have your body mass index (BMI) calculated to screen for obesity.  BMI is a measure of body fat based on height and weight.  You can calculate your own BMI at https://www.west-esparza.com/  Cholesterol- Have your cholesterol checked every year.  Diabetes- Have your blood sugar checked every year if you have high blood pressure, high cholesterol, a family history of diabetes or if you are overweight.  Pap Test - Have a pap test every 1 to 5 years if you have been sexually active.  If you are older than 65 and recent pap tests have been normal you may not need additional pap tests.  In addition, if you have had a hysterectomy  for benign disease additional pap tests are not necessary.  Mammogram-Yearly mammograms are essential for early detection of breast cancer  Screening for Colon Cancer- Colonoscopy starting at age 28. Screening may begin sooner depending on your family history and other health conditions.  Follow up colonoscopy as directed by your Gastroenterologist.  Screening for Osteoporosis- Screening begins at age 79 with bone density scanning, sooner if you are at higher risk for developing Osteoporosis.  Get these medicines  Calcium with Vitamin D- Your body requires 1200-1500 mg of Calcium a day and [phone] IU of Vitamin D a day.  You can only absorb 500 mg of Calcium at a time therefore Calcium must be taken in 2 or 3 separate doses throughout the day.  Hormones- Hormone therapy has been associated with increased risk for certain cancers and heart disease.  Talk to your healthcare provider about if you need relief from menopausal symptoms.  Aspirin- Ask your healthcare provider about taking  Aspirin to prevent Heart Disease and Stroke.  Get these Immuniztions  Flu shot- Every fall  Pneumonia shot- Once after the age of 82; if you are younger ask your healthcare provider if you need a pneumonia shot.  Tetanus- Every ten years.  Zostavax- Once after the age of 58 to prevent shingles.  Take these steps  Don't smoke- Your healthcare provider can help you quit. For tips on how to quit, ask your healthcare provider or go to www.smokefree.gov or call 1-800 QUIT-NOW.  Be physically active- Exercise 5 days a week for a minimum of 30 minutes.  If you are not already physically active, start slow and gradually work up to 30 minutes of moderate physical activity.  Try walking, dancing, bike riding, swimming, etc.  Eat a healthy diet- Eat a variety of healthy foods such as fruits, vegetables, whole grains, low fat milk, low fat cheeses, yogurt, lean meats, chicken, fish, eggs, dried beans, tofu, etc.  For more information go to www.thenutritionsource.org  Dental visit- Brush and floss teeth twice daily; visit your dentist twice a year.  Eye exam- Visit your Optometrist or Ophthalmologist yearly.  Drink alcohol in moderation- Limit alcohol intake to one drink or less a day.  Never drink and drive.  Depression- Your emotional health is as important as your physical health.  If you're feeling down or losing interest in things you normally enjoy, please talk to your healthcare provider.  Seat Belts- can save your life; always wear one  Smoke/Carbon Monoxide detectors- These  detectors need to be installed on the appropriate level of your home.  Replace batteries at least once a year.  Violence- If anyone is threatening or hurting you, please tell your healthcare provider.  Living Will/ Health care power of attorney- Discuss with your healthcare provider and family.

## 2016-12-17 NOTE — Progress Notes (Signed)
Subjective:     Rebecca Berry is a 56 y.o. female and is here for a comprehensive physical exam. The patient reports problems - she would like to repeal the decision to deny her life insurance basedon wandering pacemaker syndrome, HTN, aortic atherosclerosis. she has not had any cardiac symptoms in 7 plus years. her cholesterol has always been great. .  Social History   Social History  . Marital status: Married    Spouse name: N/A  . Number of children: N/A  . Years of education: N/A   Occupational History  . Not on file.   Social History Main Topics  . Smoking status: Former Smoker    Quit date: 03/26/2012  . Smokeless tobacco: Never Used  . Alcohol use No  . Drug use: No  . Sexual activity: Yes    Partners: Male   Other Topics Concern  . Not on file   Social History Narrative  . No narrative on file   Health Maintenance  Topic Date Due  . Hepatitis C Screening  Jul 25, 1961  . HIV Screening  02/24/1975  . INFLUENZA VACCINE  03/25/2017  . MAMMOGRAM  07/04/2018  . PAP SMEAR  06/19/2019  . COLONOSCOPY  08/25/2021  . TETANUS/TDAP  05/26/2023    The following portions of the patient's history were reviewed and updated as appropriate: allergies, current medications, past family history, past medical history, past social history, past surgical history and problem list.  Review of Systems A comprehensive review of systems was negative.   Objective:    BP 132/84   Pulse 91   Ht  (1.626 m)   Wt 152 lb (68.9 kg)   BMI 26.09 kg/m  General appearance: alert, cooperative and appears stated age Head: Normocephalic, without obvious abnormality, atraumatic Eyes: conjunctivae/corneas clear. PERRL, EOM's intact. Fundi benign. Ears: normal TM's and external ear canals both ears Nose: Nares normal. Septum midline. Mucosa normal. No drainage or sinus tenderness. Throat: lips, mucosa, and tongue normal; teeth and gums normal Neck: no adenopathy, no carotid bruit, no JVD,  supple, symmetrical, trachea midline and thyroid not enlarged, symmetric, no tenderness/mass/nodules Back: symmetric, no curvature. ROM normal. No CVA tenderness. Lungs: clear to auscultation bilaterally Heart: regular rate and rhythm, S1, S2 normal, no murmur, click, rub or gallop Abdomen: soft, non-tender; bowel sounds normal; no masses,  no organomegaly Extremities: extremities normal, atraumatic, no cyanosis or edema Pulses: 2+ and symmetric Skin: Skin color, texture, turgor normal. No rashes or lesions Lymph nodes: Cervical, supraclavicular, and axillary nodes normal. Neurologic: Alert and oriented X 3, normal strength and tone. Normal symmetric reflexes. Normal coordination and gait    Assessment:    Healthy female exam.      Plan:  Marland KitchenMarland KitchenCarlo was seen today for annual exam.  Diagnoses and all orders for this visit:  Routine physical examination  Aortic atherosclerosis (HCC) -     atorvastatin (LIPITOR) 40 MG tablet; Take 1 tablet (40 mg total) by mouth daily. -     DG Chest 2 View -     VAS US CAROTID; Future -     VAS US CAROTID  Essential hypertension, benign -     benazepril (LOTENSIN) 10 MG tablet; Take 1 tablet (10 mg total) by mouth daily.  Wandering pacemaker -     Holter monitor - 48 hour; Future -     Holter monitor - 48 hour  Primary insomnia -     zolpidem (AMBIEN) 5 MG tablet; Take one-half to one tablet  by mouth at bedtime as needed for sleep.  Migraine without aura and without status migrainosus, not intractable -     SUMAtriptan (IMITREX) 50 MG tablet; TAKE ONE TABLET BY MOUTH EVERY 2 HOURS AS NEEDED FOR MIGRAINE OR HEADACHE. MAY REPEAT IN 2 HOURS IF HEADACHE PERSISTS OR RECURS.  History of cellulitis -     cephALEXin (KEFLEX) 500 MG capsule; Take 1 capsule (500 mg total) by mouth 2 (two) times daily.  Former smoker -     VAS US CAROTID; Future -     VAS US CAROTID   Will order carotid doppler, CXR, holter to see if any changes or improvement.  Pt would like to submit new data to repeal denial of insurance. She has been asyptomatic for 7 plus years and doing great.   Mammogram/pap smear/colonoscopy up to date.    See After Visit Summary for Counseling Recommendations

## 2016-12-18 NOTE — Addendum Note (Signed)
Addended by: Donne Anon L on: 12/18/2016 11:19 AM   Modules accepted: Orders

## 2016-12-19 NOTE — Progress Notes (Signed)
So the test completely different things but in my opinion it is more important that stress test is negative and shows no ischemia. I feel like CT scan of abdomen will continue to show some atherosclerosis despite statin therapy. However if CT scan didn't show any that would be great for insurance coverage but you could say it was resolved.

## 2016-12-26 ENCOUNTER — Inpatient Hospital Stay (HOSPITAL_COMMUNITY): Admission: RE | Admit: 2016-12-26 | Payer: BLUE CROSS/BLUE SHIELD | Source: Ambulatory Visit

## 2016-12-30 ENCOUNTER — Ambulatory Visit (HOSPITAL_COMMUNITY)
Admission: RE | Admit: 2016-12-30 | Discharge: 2016-12-30 | Disposition: A | Discharge status: Home / Self Care | Payer: BLUE CROSS/BLUE SHIELD | Source: Ambulatory Visit | Attending: Cardiology | Admitting: Cardiology

## 2016-12-30 DIAGNOSIS — I700 Atherosclerosis of aorta: Secondary | ICD-10-CM | POA: Diagnosis not present

## 2016-12-30 DIAGNOSIS — Z87891 Personal history of nicotine dependence: Secondary | ICD-10-CM

## 2016-12-30 DIAGNOSIS — I10 Essential (primary) hypertension: Secondary | ICD-10-CM | POA: Diagnosis not present

## 2017-01-07 ENCOUNTER — Ambulatory Visit (INDEPENDENT_AMBULATORY_CARE_PROVIDER_SITE_OTHER): Payer: BLUE CROSS/BLUE SHIELD

## 2017-01-07 DIAGNOSIS — I498 Other specified cardiac arrhythmias: Secondary | ICD-10-CM | POA: Diagnosis not present

## 2017-01-12 ENCOUNTER — Encounter: Payer: Self-pay | Admitting: Physician Assistant

## 2017-01-12 DIAGNOSIS — I493 Ventricular premature depolarization: Secondary | ICD-10-CM | POA: Insufficient documentation

## 2017-01-16 NOTE — Progress Notes (Signed)
Ok for nurse visit

## 2017-01-27 ENCOUNTER — Ambulatory Visit (INDEPENDENT_AMBULATORY_CARE_PROVIDER_SITE_OTHER): Payer: BLUE CROSS/BLUE SHIELD | Admitting: Physician Assistant

## 2017-01-27 VITALS — BP 137/73 | HR 58

## 2017-01-27 DIAGNOSIS — I498 Other specified cardiac arrhythmias: Secondary | ICD-10-CM | POA: Diagnosis not present

## 2017-01-27 NOTE — Progress Notes (Signed)
Pt came into clinic today for EKG. This is required per insurance. Pt is trying to get a life insurance plan approval.    EKG- sinus bradycardia without any arrthymia. Please include EKG, CXR, labs to fax to insurance company. Pt's atherosclerosis is under control. Tandy GawJade Breeback PA-C

## 2017-01-28 ENCOUNTER — Encounter: Payer: Self-pay | Admitting: Physician Assistant

## 2017-03-10 ENCOUNTER — Telehealth: Payer: Self-pay

## 2017-03-10 DIAGNOSIS — G43009 Migraine without aura, not intractable, without status migrainosus: Secondary | ICD-10-CM

## 2017-03-10 MED ORDER — GABAPENTIN 300 MG PO CAPS: 300.0000 mg | ORAL_CAPSULE | Freq: Every day | ORAL | 0 refills | Status: DC

## 2017-03-10 NOTE — Telephone Encounter (Signed)
Prescription sent for 30 day supply of Gabapentin Patient will need a follow-up appointment for refills

## 2017-03-10 NOTE — Telephone Encounter (Signed)
Pt states she has previously been on gabapentin for migraines but she decreased medication until she no longer needed. Pt states have recently started coming back and more frequently and would like to get back on the gabapentin for prophylactic treatment. Tried to schedule pt for appointment but she is going out of town and can not make an appointment. Please advise.

## 2017-05-19 ENCOUNTER — Encounter: Payer: Self-pay | Admitting: Physician Assistant

## 2017-05-19 ENCOUNTER — Ambulatory Visit (INDEPENDENT_AMBULATORY_CARE_PROVIDER_SITE_OTHER): Payer: BLUE CROSS/BLUE SHIELD | Admitting: Physician Assistant

## 2017-05-19 VITALS — BP 139/75 | HR 76 | Ht 64.0 in | Wt 149.0 lb

## 2017-05-19 DIAGNOSIS — R232 Flushing: Secondary | ICD-10-CM | POA: Diagnosis not present

## 2017-05-19 DIAGNOSIS — G43009 Migraine without aura, not intractable, without status migrainosus: Secondary | ICD-10-CM | POA: Diagnosis not present

## 2017-05-19 MED ORDER — TOPIRAMATE ER 50 MG PO CAP24: ORAL_CAPSULE | ORAL | 0 refills | Status: DC

## 2017-05-19 MED ORDER — SUMATRIPTAN SUCCINATE 50 MG PO TABS: ORAL_TABLET | ORAL | 5 refills | Status: DC

## 2017-05-19 NOTE — Progress Notes (Signed)
   Subjective:    Patient ID: Rebecca Berry, female    DOB: [birth year], 56 y.o.   MRN: [id number]  HPI  Pt is a 56 yo female who presents to the clinic to discuss worsening migraines. She is having a daily headache with 9/10 turning into a migraine. Started 8 months ago when she was trying an OTC weight loss supplement. She stopped supplement but HA did not go away. No aura with theses. She is sensitive to light. No n/v. Mainly on the right but can be on either side. imitrex helps but she is having to take too often and running out. Not on preventative. She is taking loads of NSAIDs. No GI issues currently.   .. Active Ambulatory Problems    Diagnosis Date Noted  . Preventive measure 11/15/2013  . Essential hypertension, benign 11/15/2013  . Hyperlipidemia 11/15/2013  . Encounter for monitoring statin therapy 11/15/2013  . Wandering pacemaker 11/15/2013  . Migraine 11/15/2013  . Unintentional weight change 11/15/2013  . Hearing loss 11/15/2013  . Fatigue 11/16/2013  . Plantar fasciitis, left 02/27/2014  . Hiatal hernia 03/10/2014  . Hepatic steatosis 03/10/2014  . Atherosclerosis of aorta (HCC) 03/12/2014  . Insomnia 05/17/2015  . Situational anxiety 03/18/2016  . History of cellulitis 12/17/2016  . PVC (premature ventricular contraction) 01/12/2017  . Hot flashes 05/21/2017   Resolved Ambulatory Problems    Diagnosis Date Noted  . No Resolved Ambulatory Problems   Past Medical History:  Diagnosis Date  . Hyperlipidemia   . Hypertension   . Pacemaker syndrome     Review of Systems  All other systems reviewed and are negative.      Objective:   Physical Exam  Constitutional: She is oriented to person, place, and time. She appears well-developed and well-nourished.  HENT:  Head: Normocephalic and atraumatic.  Eyes: Pupils are equal, round, and reactive to light. Conjunctivae and EOM are normal.  Neck: Normal range of motion. Neck supple.  Cardiovascular: Normal  rate, regular rhythm and normal heart sounds.   Pulmonary/Chest: Effort normal and breath sounds normal.  Neurological: She is alert and oriented to person, place, and time. She has normal reflexes. She displays normal reflexes. No cranial nerve deficit. Coordination normal.  Psychiatric: She has a normal mood and affect. Her behavior is normal.          Assessment & Plan:  Marland KitchenMarland KitchenDiagnoses and all orders for this visit:  Migraine without aura and without status migrainosus, not intractable -     Topiramate ER (TROKENDI XR) 50 MG CP24; Take one tablet daily. -     SUMAtriptan (IMITREX) 50 MG tablet; TAKE ONE TABLET BY MOUTH EVERY 2 HOURS AS NEEDED FOR MIGRAINE OR HEADACHE. MAY REPEAT IN 2 HOURS IF HEADACHE PERSISTS OR RECURS.  Hot flashes   No neuro deficits.  Start trokendi. Coupon card given. Side effects discussed. Follow up in 4 weeks.  imitrex for rescue.   Discussed conservtive treatment for hot flashes. Pt does not want to consider HRT at this point.

## 2017-05-19 NOTE — Patient Instructions (Signed)
Menopause Menopause is the normal time of life when menstrual periods stop completely. Menopause is complete when you have missed 12 consecutive menstrual periods. It usually occurs between the ages of 48 years and 55 years. Very rarely does a woman develop menopause before the age of 40 years. At menopause, your ovaries stop producing the female hormones estrogen and progesterone. This can cause undesirable symptoms and also affect your health. Sometimes the symptoms may occur 4-5 years before the menopause begins. There is no relationship between menopause and:  Oral contraceptives.  Number of children you had.  Race.  The age your menstrual periods started (menarche).  Heavy smokers and very thin women may develop menopause earlier in life. What are the causes?  The ovaries stop producing the female hormones estrogen and progesterone. Other causes include:  Surgery to remove both ovaries.  The ovaries stop functioning for no known reason.  Tumors of the pituitary gland in the brain.  Medical disease that affects the ovaries and hormone production.  Radiation treatment to the abdomen or pelvis.  Chemotherapy that affects the ovaries.  What are the signs or symptoms?  Hot flashes.  Night sweats.  Decrease in sex drive.  Vaginal dryness and thinning of the vagina causing painful intercourse.  Dryness of the skin and developing wrinkles.  Headaches.  Tiredness.  Irritability.  Memory problems.  Weight gain.  Bladder infections.  Hair growth of the face and chest.  Infertility. More serious symptoms include:  Loss of bone (osteoporosis) causing breaks (fractures).  Depression.  Hardening and narrowing of the arteries (atherosclerosis) causing heart attacks and strokes.  How is this diagnosed?  When the menstrual periods have stopped for 12 straight months.  Physical exam.  Hormone studies of the blood. How is this treated? There are many treatment  choices and nearly as many questions about them. The decisions to treat or not to treat menopausal changes is an individual choice made with your health care provider. Your health care provider can discuss the treatments with you. Together, you can decide which treatment will work best for you. Your treatment choices may include:  Hormone therapy (estrogen and progesterone).  Non-hormonal medicines.  Treating the individual symptoms with medicine (for example antidepressants for depression).  Herbal medicines that may help specific symptoms.  Counseling by a psychiatrist or psychologist.  Group therapy.  Lifestyle changes including: ? Eating healthy. ? Regular exercise. ? Limiting caffeine and alcohol. ? Stress management and meditation.  No treatment.  Follow these instructions at home:  Take the medicine your health care provider gives you as directed.  Get plenty of sleep and rest.  Exercise regularly.  Eat a diet that contains calcium (good for the bones) and soy products (acts like estrogen hormone).  Avoid alcoholic beverages.  Do not smoke.  If you have hot flashes, dress in layers.  Take supplements, calcium, and vitamin D to strengthen bones.  You can use over-the-counter lubricants or moisturizers for vaginal dryness.  Group therapy is sometimes very helpful.  Acupuncture may be helpful in some cases. Contact a health care provider if:  You are not sure you are in menopause.  You are having menopausal symptoms and need advice and treatment.  You are still having menstrual periods after age 55 years.  You have pain with intercourse.  Menopause is complete (no menstrual period for 12 months) and you develop vaginal bleeding.  You need a referral to a specialist (gynecologist, psychiatrist, or psychologist) for treatment. Get help right   away if:  You have severe depression.  You have excessive vaginal bleeding.  You fell and think you have a  broken bone.  You have pain when you urinate.  You develop leg or chest pain.  You have a fast pounding heart beat (palpitations).  You have severe headaches.  You develop vision problems.  You feel a lump in your breast.  You have abdominal pain or severe indigestion. This information is not intended to replace advice given to you by your health care provider. Make sure you discuss any questions you have with your health care provider. Document Released: 11/01/2003 Document Revised: 01/17/2016 Document Reviewed: 03/10/2013 Elsevier Interactive Patient Education  2017 [employer]  

## 2017-05-21 DIAGNOSIS — R232 Flushing: Secondary | ICD-10-CM | POA: Insufficient documentation

## 2017-06-03 ENCOUNTER — Other Ambulatory Visit: Payer: Self-pay | Admitting: Physician Assistant

## 2017-06-03 DIAGNOSIS — Z9289 Personal history of other medical treatment: Secondary | ICD-10-CM

## 2017-06-12 ENCOUNTER — Encounter: Payer: Self-pay | Admitting: Physician Assistant

## 2017-06-12 DIAGNOSIS — G8929 Other chronic pain: Secondary | ICD-10-CM

## 2017-06-12 DIAGNOSIS — G43009 Migraine without aura, not intractable, without status migrainosus: Secondary | ICD-10-CM

## 2017-06-12 DIAGNOSIS — R51 Headache: Secondary | ICD-10-CM

## 2017-06-12 DIAGNOSIS — F5101 Primary insomnia: Secondary | ICD-10-CM

## 2017-06-12 DIAGNOSIS — R519 Headache, unspecified: Secondary | ICD-10-CM

## 2017-06-12 MED ORDER — TOPIRAMATE ER 100 MG PO CAP24: 1.0000 | ORAL_CAPSULE | Freq: Every day | ORAL | 1 refills | Status: DC

## 2017-06-12 NOTE — Telephone Encounter (Signed)
Jade which blood test is she referring to?

## 2017-06-16 ENCOUNTER — Other Ambulatory Visit: Payer: Self-pay | Admitting: Physician Assistant

## 2017-06-16 DIAGNOSIS — I10 Essential (primary) hypertension: Secondary | ICD-10-CM

## 2017-06-16 NOTE — Addendum Note (Signed)
Addended by: Monica BectonHEKKEKANDAM, THOMAS J on: 06/16/2017 08:44 AM   Modules accepted: Orders

## 2017-06-16 NOTE — Progress Notes (Signed)
Lab order placed per imaging protocol.  

## 2017-06-26 LAB — COMPLETE METABOLIC PANEL WITH GFR
AG Ratio: 2 (calc) (ref 1.0–2.5)
ALKALINE PHOSPHATASE (APISO): 94 U/L (ref 33–130)
ALT: 17 U/L (ref 6–29)
AST: 19 U/L (ref 10–35)
Albumin: 4.2 g/dL (ref 3.6–5.1)
BILIRUBIN TOTAL: 0.8 mg/dL (ref 0.2–1.2)
BUN: 14 mg/dL (ref 7–25)
CHLORIDE: 109 mmol/L (ref 98–110)
CO2: 23 mmol/L (ref 20–32)
CREATININE: 0.88 mg/dL (ref 0.50–1.05)
Calcium: 9.4 mg/dL (ref 8.6–10.4)
GFR, Est African American: 85 mL/min/{1.73_m2} (ref >=60)
GFR, Est Non African American: 73 mL/min/{1.73_m2} (ref >=60)
GLUCOSE: 90 mg/dL (ref 65–99)
Globulin: 2.1 g/dL (calc) (ref 1.9–3.7)
Potassium: 4.1 mmol/L (ref 3.5–5.3)
Sodium: 141 mmol/L (ref 135–146)
TOTAL PROTEIN: 6.3 g/dL (ref 6.1–8.1)

## 2017-06-29 ENCOUNTER — Ambulatory Visit (INDEPENDENT_AMBULATORY_CARE_PROVIDER_SITE_OTHER): Payer: BLUE CROSS/BLUE SHIELD

## 2017-06-29 DIAGNOSIS — R51 Headache: Secondary | ICD-10-CM | POA: Diagnosis not present

## 2017-06-29 DIAGNOSIS — R519 Headache, unspecified: Secondary | ICD-10-CM

## 2017-06-29 DIAGNOSIS — G8929 Other chronic pain: Secondary | ICD-10-CM

## 2017-06-29 MED ORDER — GADOBENATE DIMEGLUMINE 529 MG/ML IV SOLN
13.0000 mL | Freq: Once | INTRAVENOUS | Status: AC | PRN
  Administered 2017-06-29: 13 mL via INTRAVENOUS

## 2017-07-08 ENCOUNTER — Ambulatory Visit (INDEPENDENT_AMBULATORY_CARE_PROVIDER_SITE_OTHER): Payer: BLUE CROSS/BLUE SHIELD

## 2017-07-08 DIAGNOSIS — Z1231 Encounter for screening mammogram for malignant neoplasm of breast: Secondary | ICD-10-CM

## 2017-07-08 DIAGNOSIS — Z9289 Personal history of other medical treatment: Secondary | ICD-10-CM

## 2017-07-08 NOTE — Progress Notes (Signed)
Call pt: normal mammogram. Follow up in one year.

## 2017-07-30 ENCOUNTER — Other Ambulatory Visit: Payer: Self-pay | Admitting: Physician Assistant

## 2017-07-30 DIAGNOSIS — F5101 Primary insomnia: Secondary | ICD-10-CM

## 2017-08-27 ENCOUNTER — Other Ambulatory Visit: Payer: Self-pay | Admitting: Sports Medicine

## 2017-08-27 NOTE — Telephone Encounter (Signed)
Is this ok to refill. Pt has not been back to f/u on medication.

## 2017-10-02 ENCOUNTER — Ambulatory Visit (INDEPENDENT_AMBULATORY_CARE_PROVIDER_SITE_OTHER): Payer: BLUE CROSS/BLUE SHIELD | Admitting: Physician Assistant

## 2017-10-02 VITALS — BP 131/59 | HR 66 | Ht 64.0 in | Wt 142.0 lb

## 2017-10-02 DIAGNOSIS — I700 Atherosclerosis of aorta: Secondary | ICD-10-CM

## 2017-10-02 DIAGNOSIS — I10 Essential (primary) hypertension: Secondary | ICD-10-CM | POA: Diagnosis not present

## 2017-10-02 DIAGNOSIS — G43009 Migraine without aura, not intractable, without status migrainosus: Secondary | ICD-10-CM

## 2017-10-02 MED ORDER — BENAZEPRIL HCL 10 MG PO TABS: 10.0000 mg | ORAL_TABLET | Freq: Every day | ORAL | 1 refills | Status: DC

## 2017-10-02 MED ORDER — TOPIRAMATE ER 100 MG PO CAP24: 1.0000 | ORAL_CAPSULE | Freq: Every day | ORAL | 4 refills | Status: DC

## 2017-10-02 MED ORDER — ATORVASTATIN CALCIUM 40 MG PO TABS: 40.0000 mg | ORAL_TABLET | Freq: Every day | ORAL | 1 refills | Status: DC

## 2017-10-02 NOTE — Progress Notes (Signed)
   Subjective:    Patient ID: Rebecca Berry, female    DOB: 06/17/1961, 57 y.o.   MRN: [id number]  HPI  Pt is a 57 yo female with HTN, migraines, hyperlipidemia who presents to the clinic for medication refill.   [place] and will need rx. She had fasting labs done in 11/2016.   Migraines are doing much better. She has not had to use rescue at all in the last 2 months. She did have some brain fog experiences right after starting. They have since improved and not had one since. Overall she is tolerating well.   .. Active Ambulatory Problems    Diagnosis Date Noted  . Preventive measure 11/15/2013  . Essential hypertension, benign 11/15/2013  . Hyperlipidemia 11/15/2013  . Encounter for monitoring statin therapy 11/15/2013  . Wandering pacemaker 11/15/2013  . Migraine 11/15/2013  . Unintentional weight change 11/15/2013  . Hearing loss 11/15/2013  . Fatigue 11/16/2013  . Plantar fasciitis, left 02/27/2014  . Hiatal hernia 03/10/2014  . Hepatic steatosis 03/10/2014  . Atherosclerosis of aorta (HCC) 03/12/2014  . Insomnia 05/17/2015  . Situational anxiety 03/18/2016  . History of cellulitis 12/17/2016  . PVC (premature ventricular contraction) 01/12/2017  . Hot flashes 05/21/2017   Resolved Ambulatory Problems    Diagnosis Date Noted  . No Resolved Ambulatory Problems   Past Medical History:  Diagnosis Date  . Hyperlipidemia   . Hypertension   . Pacemaker syndrome      Review of Systems  All other systems reviewed and are negative.      Objective:   Physical Exam  Constitutional: She is oriented to person, place, and time. She appears well-developed and well-nourished.  HENT:  Head: Normocephalic and atraumatic.  Cardiovascular: Normal rate, regular rhythm and normal heart sounds.  Pulmonary/Chest: Effort normal and breath sounds normal.  Neurological: She is alert and oriented to person, place, and time.   Psychiatric: She has a normal mood and affect. Her behavior is normal.          Assessment & Plan:  [name].[name].Diagnoses and all orders for this visit:  Migraine without aura and without status migrainosus, not intractable -     Topiramate ER (TROKENDI XR) 100 MG CP24; Take 1 capsule by mouth at bedtime.  Essential hypertension, benign -     benazepril (LOTENSIN) 10 MG tablet; Take 1 tablet (10 mg total) by mouth daily.  Aortic atherosclerosis (HCC) -     atorvastatin (LIPITOR) 40 MG tablet; Take 1 tablet (40 mg total) by mouth daily.   .. [facility] 10/02/2017 05/19/2017 12/17/2016  Decreased Interest 0 0 0  Down, Depressed, Hopeless 0 0 0  PHQ - 2 Score 0 0 0     trokendi refilled for one year. Coupon card given.   Labs done 11/2016. Refilled until get established with Dr. Ivan AnchorsHommel.   Discussed shingrix. Pt will check with insurance and call for nurse visit to start series.

## 2017-10-04 ENCOUNTER — Encounter: Payer: Self-pay | Admitting: Physician Assistant

## 2017-10-12 ENCOUNTER — Ambulatory Visit: Payer: BLUE CROSS/BLUE SHIELD | Admitting: Physician Assistant

## 2017-10-15 ENCOUNTER — Encounter: Payer: Self-pay | Admitting: Physician Assistant

## 2017-10-15 DIAGNOSIS — I700 Atherosclerosis of aorta: Secondary | ICD-10-CM

## 2017-10-15 MED ORDER — ATORVASTATIN CALCIUM 40 MG PO TABS: 40.0000 mg | ORAL_TABLET | Freq: Every day | ORAL | 3 refills | Status: DC

## 2017-10-15 MED ORDER — DIAZEPAM 10 MG PO TABS: 5.0000 mg | ORAL_TABLET | Freq: Two times a day (BID) | ORAL | 1 refills | Status: DC | PRN

## 2017-10-28 ENCOUNTER — Ambulatory Visit: Payer: BLUE CROSS/BLUE SHIELD

## 2017-11-25 ENCOUNTER — Ambulatory Visit (INDEPENDENT_AMBULATORY_CARE_PROVIDER_SITE_OTHER): Payer: BLUE CROSS/BLUE SHIELD | Admitting: Physician Assistant

## 2017-11-25 VITALS — BP 140/59 | HR 74 | Temp 98.9°F | Resp 18

## 2017-11-25 DIAGNOSIS — Z23 Encounter for immunization: Secondary | ICD-10-CM

## 2017-11-25 NOTE — Progress Notes (Signed)
Patient received #1 Shingrix in right deltoid. Follow up in 2-6 months for next injection.   Agree with above plan. Tandy GawJade Breeback PA-C

## 2017-11-26 ENCOUNTER — Ambulatory Visit (INDEPENDENT_AMBULATORY_CARE_PROVIDER_SITE_OTHER): Payer: BLUE CROSS/BLUE SHIELD | Admitting: Sports Medicine

## 2017-11-26 ENCOUNTER — Ambulatory Visit (INDEPENDENT_AMBULATORY_CARE_PROVIDER_SITE_OTHER): Payer: BLUE CROSS/BLUE SHIELD

## 2017-11-26 ENCOUNTER — Encounter: Payer: Self-pay | Admitting: Sports Medicine

## 2017-11-26 DIAGNOSIS — M899 Disorder of bone, unspecified: Secondary | ICD-10-CM | POA: Diagnosis not present

## 2017-11-26 DIAGNOSIS — M25512 Pain in left shoulder: Secondary | ICD-10-CM | POA: Diagnosis not present

## 2017-11-26 DIAGNOSIS — M898X9 Other specified disorders of bone, unspecified site: Secondary | ICD-10-CM | POA: Insufficient documentation

## 2017-11-26 DIAGNOSIS — M75102 Unspecified rotator cuff tear or rupture of left shoulder, not specified as traumatic: Secondary | ICD-10-CM | POA: Insufficient documentation

## 2017-11-26 MED ORDER — MELOXICAM 15 MG PO TABS: ORAL_TABLET | ORAL | 3 refills | Status: DC

## 2017-11-26 NOTE — Addendum Note (Signed)
Addended by: Monica BectonHEKKEKANDAM, THOMAS J on: 11/26/2017 05:04 PM   Modules accepted: Orders

## 2017-11-26 NOTE — Assessment & Plan Note (Signed)
Left message on machine after 2 attempts to call her, I have advised that we do need an MRI of the humerus with contrast as well as a serum and urine protein electrophoresis.  Orders placed.

## 2017-11-26 NOTE — Assessment & Plan Note (Signed)
Left pain, referrable to the glenohumeral joint. Rehab exercises, meloxicam, tramadol for breakthrough pain.   Return to see me in 4 weeks, glenohumeral injection if no better.

## 2017-11-26 NOTE — Progress Notes (Signed)
Subjective:    I'm seeing this patient as a consultation for:  Tandy GawJade Breeback, PA-C  CC:  Left shoulder pain  HPI: Rebecca Berry is a 57yo female who presents today with left shoulder pain. Pt has been experiencing throbbing/aching shoulder pain for the last 2 months but it has worsened considerably to include a burning quality. Pt reports that the aching pain in her shoulder is with her when she wakes up and does not improve or worsen during the day.  Pt denies changes to her range of motion and  mechanical and locking issues.  Pt endorses unsuccessfully utilizing ibuprofen to relieve the pain.  Pt also reports that ice and rest did not adequately relieve the pain.  Pt endorses increased activity (moving boxes etc) in the same time frame.   I reviewed the past medical history, family history, social history, surgical history, and allergies today and no changes were needed.  Please see the problem list section below in epic for further details.  Past Medical History: Past Medical History:  Diagnosis Date  . Hyperlipidemia   . Hypertension   . Pacemaker syndrome    Past Surgical History: Past Surgical History:  Procedure Laterality Date  . BICEPS TENDON REPAIR  08/2007  . BREAST BIOPSY  12/1997  . CHOLECYSTECTOMY  05/2003  . TONSILLECTOMY AND ADENOIDECTOMY  1962   Social History: Social History   Socioeconomic History  . Marital status: Married    Spouse name: Not on file  . Number of children: Not on file  . Years of education: Not on file  . Highest education level: Not on file  Occupational History  . Not on file  Social Needs  . Financial resource strain: Not on file  . Food insecurity:    Worry: Not on file    Inability: Not on file  . Transportation needs:    Medical: Not on file    Non-medical: Not on file  Tobacco Use  . Smoking status: Former Smoker    Last attempt to quit: 03/26/2012    Years since quitting: 5.6  . Smokeless tobacco: Never Used  Substance  and Sexual Activity  . Alcohol use: No  . Drug use: No  . Sexual activity: Yes    Partners: Male  Lifestyle  . Physical activity:    Days per week: Not on file    Minutes per session: Not on file  . Stress: Not on file  Relationships  . Social connections:    Talks on phone: Not on file    Gets together: Not on file    Attends religious service: Not on file    Active member of club or organization: Not on file    Attends meetings of clubs or organizations: Not on file    Relationship status: Not on file  Other Topics Concern  . Not on file  Social History Narrative  . Not on file   Family History: Family History  Problem Relation Age of Onset  . Alcoholism Father   . Bladder Cancer Father   . Heart attack Father   . Hypertension Father   . Lung cancer Mother   . Leukemia Paternal Grandmother    Allergies: Allergies  Allergen Reactions  . Vibramycin [Doxycycline Calcium] Hives   Medications: See med rec.  Review of Systems: No headache, visual changes, nausea, vomiting, diarrhea, constipation, dizziness, abdominal pain, skin rash, fevers, chills, night sweats, weight loss, swollen lymph nodes, body aches, joint swelling, muscle aches, chest  pain, shortness of breath, mood changes, visual or auditory hallucinations reported.   Objective:   General: Well Developed, well nourished, and in no acute distress.  Neuro:  Extra-ocular muscles intact, able to move all 4 extremities, sensation grossly intact.  Deep tendon reflexes tested were normal. Psych: Alert and oriented, mood congruent with affect. ENT:  Ears and nose appear unremarkable.  Hearing grossly normal. Neck: Unremarkable overall appearance, trachea midline.  No visible thyroid enlargement. Eyes: Conjunctivae and lids appear unremarkable.  Pupils equal and round. Skin: Warm and dry, no rashes noted.  Cardiovascular: Pulses palpable, no extremity edema.  Shoulder: Inspection reveals no abnormalities, atrophy or  asymmetry. Palpation is normal with no tenderness over AC joint or bicipital groove. Palpation with tenderness over the [facility] joint ROM is full in all planes. Rotator cuff strength normal throughout. While negative, pt endorsed a burning  pain centered around the [facility] joint with Neer and Hawkin's tests, empty can sign. Crepitation was also elicited during the Neer maneuver.  While negative, pt endorsed a burning pain centered around the [facility] joint with Speeds and Yergason's tests. No labral pathology noted with negative Obrien's, negative clunk and good stability. Normal scapular function observed. No painful arc and no drop arm sign. No apprehension sign  .  Impression and Recommendations:   This case required medical decision making of moderate complexity.  Assesment-Arthritis of the Glenohumeral Joint Pt was sent for imaging (x-ray) for further evaluation of the bones articulating with the [facility] joint.  Pt was counseled to engage in physical therapy nightly to maintain range of motion and strength.    Pt was prescribed once daily dose of Meloxicam to alleviate the shoulder pain and Tramadol for breakthrough pain.    Pt was counseled about the use of a glucocorticoid injection to provide immediate relief.  Pt will proceed conservatively at this time.   ___________________________________________ Ihor Austin. Benjamin Stain, M.D., ABFM., CAQSM. Primary Care and Sports Medicine [facility]  Adjunct Instructor of Family Medicine  [employer]

## 2017-11-30 ENCOUNTER — Ambulatory Visit (INDEPENDENT_AMBULATORY_CARE_PROVIDER_SITE_OTHER): Payer: BLUE CROSS/BLUE SHIELD

## 2017-11-30 ENCOUNTER — Telehealth: Payer: Self-pay | Admitting: Sports Medicine

## 2017-11-30 DIAGNOSIS — M899 Disorder of bone, unspecified: Secondary | ICD-10-CM

## 2017-11-30 DIAGNOSIS — M25612 Stiffness of left shoulder, not elsewhere classified: Secondary | ICD-10-CM

## 2017-11-30 DIAGNOSIS — M25512 Pain in left shoulder: Secondary | ICD-10-CM | POA: Diagnosis not present

## 2017-11-30 MED ORDER — GADOBENATE DIMEGLUMINE 529 MG/ML IV SOLN
15.0000 mL | Freq: Once | INTRAVENOUS | Status: AC | PRN
  Administered 2017-11-30: 13 mL via INTRAVENOUS

## 2017-11-30 MED ORDER — TRAMADOL HCL 50 MG PO TABS: 50.0000 mg | ORAL_TABLET | Freq: Three times a day (TID) | ORAL | 0 refills | Status: DC | PRN

## 2017-11-30 NOTE — Telephone Encounter (Signed)
Tramadol sent in.  I will call her later.

## 2017-11-30 NOTE — Telephone Encounter (Signed)
PT came in today to speak with you about the voicemail you left her last week. She reported that she completed the labs and the MRI you ordered. She stated that you mentioned sending a pain med to the pharmacy but nothing was sent to the pharmacy. She is requesting that her med be sent in to the pharmacy on file before 1pm today as she is leaving town. She is also requesting that you call her back sometime after 4pm today when she will have some privacy to speak. Please advise. Thanks!

## 2017-12-02 LAB — COMPREHENSIVE METABOLIC PANEL WITH GFR
AG Ratio: 1.9 (calc) (ref 1.0–2.5)
AST: 17 U/L (ref 10–35)
Albumin: 4.1 g/dL (ref 3.6–5.1)
CO2: 27 mmol/L (ref 20–32)
Chloride: 108 mmol/L (ref 98–110)
Globulin: 2.2 g/dL (ref 1.9–3.7)
Glucose, Bld: 91 mg/dL (ref 65–99)
Sodium: 142 mmol/L (ref 135–146)

## 2017-12-02 LAB — PROTEIN ELECTROPHORESIS,RANDOM URN
Creatinine, Urine: 36 mg/dL (ref 20–275)
Total Protein, Urine: <4 mg/dL — ABNORMAL LOW (ref 5–24)

## 2017-12-02 LAB — PROTEIN ELECTROPHORESIS, SERUM, WITH REFLEX
Albumin ELP: 4.1 g/dL (ref 3.8–4.8)
Alpha 1: 0.3 g/dL (ref 0.2–0.3)
Alpha 2: 0.7 g/dL (ref 0.5–0.9)
Beta 2: 0.3 g/dL (ref 0.2–0.5)
Beta Globulin: 0.4 g/dL (ref 0.4–0.6)
Gamma Globulin: 0.7 g/dL — ABNORMAL LOW (ref 0.8–1.7)
Total Protein: 6.5 g/dL (ref 6.1–8.1)

## 2017-12-02 LAB — COMPREHENSIVE METABOLIC PANEL
ALT: 18 U/L (ref 6–29)
Alkaline phosphatase (APISO): 88 U/L (ref 33–130)
BUN: 21 mg/dL (ref 7–25)
Calcium: 9.8 mg/dL (ref 8.6–10.4)
Creat: 0.77 mg/dL (ref 0.50–1.05)
Potassium: 4.2 mmol/L (ref 3.5–5.3)
Total Bilirubin: 0.7 mg/dL (ref 0.2–1.2)
Total Protein: 6.3 g/dL (ref 6.1–8.1)

## 2017-12-02 LAB — IFE INTERPRETATION: Immunofix Electr Int: NOT DETECTED

## 2017-12-24 ENCOUNTER — Ambulatory Visit (INDEPENDENT_AMBULATORY_CARE_PROVIDER_SITE_OTHER): Payer: BLUE CROSS/BLUE SHIELD | Admitting: Sports Medicine

## 2017-12-24 ENCOUNTER — Encounter: Payer: Self-pay | Admitting: Sports Medicine

## 2017-12-24 DIAGNOSIS — M25512 Pain in left shoulder: Secondary | ICD-10-CM

## 2017-12-24 NOTE — Assessment & Plan Note (Signed)
Continues to have mild glenohumeral referrable pain, did not do the exercises. Break meloxicam in half and one half tab twice a day, the way to the NSAIDs, may continue to use tramadol for breakthrough pain. Return in 4 to 6 weeks, glenohumeral injection if no better. There does appear to be an articular sided rotator cuff tear.  I spent 40 minutes with this patient, greater than 50% was face-to-face time counseling regarding the above diagnoses

## 2017-12-24 NOTE — Progress Notes (Signed)
Subjective:    CC: f/u on left shoulder pain  HPI: Rebecca Berry is a 57yo female presenting today for f/u on left shoulder pain.  Pt reports that she has been taking the meloxicam regularly every morning with meals but it wears off by mid-afternoon and she supplements with otc ibuprofen.  Pt reports that on the days with more vigorous activity she utilizes tramadol in the evening for pain.  Pt reports that she has not been able to engage in the rehab exercises.  Pt reports that the pharmacotherapy, and  alternate icing, and heating have brought some relief (about 30%) but she is still experiencing a moderate amount of pain constantly.  Pt endorses a 7/10 of left shoulder pain.    I reviewed the past medical history, family history, social history, surgical history, and allergies today and no changes were needed.  Please see the problem list section below in epic for further details.  Past Medical History: Past Medical History:  Diagnosis Date  . Hyperlipidemia   . Hypertension   . Pacemaker syndrome    Past Surgical History: Past Surgical History:  Procedure Laterality Date  . BICEPS TENDON REPAIR  08/2007  . BREAST BIOPSY  12/1997  . CHOLECYSTECTOMY  05/2003  . TONSILLECTOMY AND ADENOIDECTOMY  1962   Social History: Social History   Socioeconomic History  . Marital status: Married    Spouse name: Not on file  . Number of children: Not on file  . Years of education: Not on file  . Highest education level: Not on file  Occupational History  . Not on file  Social Needs  . Financial resource strain: Not on file  . Food insecurity:    Worry: Not on file    Inability: Not on file  . Transportation needs:    Medical: Not on file    Non-medical: Not on file  Tobacco Use  . Smoking status: Former Smoker    Last attempt to quit: 03/26/2012    Years since quitting: 5.7  . Smokeless tobacco: Never Used  Substance and Sexual Activity  . Alcohol use: No  . Drug use: No  .  Sexual activity: Yes    Partners: Male  Lifestyle  . Physical activity:    Days per week: Not on file    Minutes per session: Not on file  . Stress: Not on file  Relationships  . Social connections:    Talks on phone: Not on file    Gets together: Not on file    Attends religious service: Not on file    Active member of club or organization: Not on file    Attends meetings of clubs or organizations: Not on file    Relationship status: Not on file  Other Topics Concern  . Not on file  Social History Narrative  . Not on file   Family History: Family History  Problem Relation Age of Onset  . Alcoholism Father   . Bladder Cancer Father   . Heart attack Father   . Hypertension Father   . Lung cancer Mother   . Leukemia Paternal Grandmother    Allergies: Allergies  Allergen Reactions  . Vibramycin [Doxycycline Calcium] Hives   Medications: See med rec.  Review of Systems: No fevers, chills, night sweats, weight loss, chest pain, or shortness of breath.   Objective:    General: Well Developed, well nourished, and in no acute distress.  Neuro: Alert and oriented x3, extra-ocular muscles intact, sensation grossly  intact.  HEENT: Normocephalic, atraumatic, pupils equal round reactive to light, neck supple, no masses, no lymphadenopathy, thyroid nonpalpable.  Skin: Warm and dry, no rashes. Cardiac: Regular rate and rhythm, no murmurs rubs or gallops, no lower extremity edema.  Respiratory: Clear to auscultation bilaterally. Not using accessory muscles, speaking in full sentences.  Left Shoulder: Inspection reveals no abnormalities, atrophy or asymmetry. Palpation is normal with no tenderness over AC joint or bicipital groove.Pain with palpation over the glenohumeral joint.  ROM is full in all planes. (flexion, extension, abduction and adduction in the horizontal plane) Rotator cuff strength normal throughout. Even though some pain was elicited during the Neer and Hawkins  maneuver there are No signs of impingement with negative Neer and Hawkin's tests, empty can sign. Speeds and Yergason's tests normal. No labral pathology noted with negative Obrien's, negative clunk and good stability. Normal scapular function observed. No painful arc and no drop arm sign. No apprehension sign   Impression and Recommendations:    Assessment-RTC tear with pain referred to [facility] Joint Pt was unable to carry out rehabilitative exercises as prescribed because she lost the paperwork ; so pt was counseled again to engage in physical therapy nightly to maintain range of motion and strength and provided with another handout; Pt was advised to not utilize both Meloxicam and Ibuprofen to alleviate pain, and was educated that the rehabilitative exercises should help to bridge the gap in pain management with meloxicam. Pt was also encouraged to continue to use Tramadol for breakthrough pain.   Pt was counseled about the use of a glucocorticoid injection to provide immediate relief.  Pt will be given more time to implement plan from last pt visit and we will continue to proceed conservatively at this time. Pt was educated about the use of formal physical therapy if she is unable to acquire relief from the nightly rehabilitative exercises she was advised to do. Pt was advised to follow up in 1 month.   ___________________________________________ Ihor Austin. Benjamin Stain, M.D., ABFM., CAQSM. Primary Care and Sports Medicine [facility]  Adjunct Instructor of Family Medicine  [employer]

## 2018-01-25 ENCOUNTER — Ambulatory Visit (INDEPENDENT_AMBULATORY_CARE_PROVIDER_SITE_OTHER): Payer: BLUE CROSS/BLUE SHIELD | Admitting: Sports Medicine

## 2018-01-25 ENCOUNTER — Encounter: Payer: Self-pay | Admitting: Sports Medicine

## 2018-01-25 ENCOUNTER — Telehealth: Payer: Self-pay | Admitting: Physician Assistant

## 2018-01-25 VITALS — BP 155/72 | HR 66 | Resp 18 | Wt 143.0 lb

## 2018-01-25 DIAGNOSIS — M25512 Pain in left shoulder: Secondary | ICD-10-CM

## 2018-01-25 DIAGNOSIS — Z23 Encounter for immunization: Secondary | ICD-10-CM

## 2018-01-25 NOTE — Telephone Encounter (Signed)
Pt advised of status update. She has not received a bill, just a statement that stated an MRI would require Prior Auth. Auth was completed and provided to Pt. No further questions.

## 2018-01-25 NOTE — Addendum Note (Signed)
Addended by: Baird KayUGLAS, Bryleigh Ottaway M on: 01/25/2018 01:54 PM   Modules accepted: Orders

## 2018-01-25 NOTE — Progress Notes (Signed)
Subjective:    CC: Left shoulder pain  HPI: This is a very pleasant 57 year old female, I saw her several times over the past few months, questionable lytic humeral lesion, looked okay on MRI, benign.  Shoulder pain, glenohumeral referrable.  Not much better with therapy, NSAIDs, tramadol.  Moderate, persistent without radiation, no mechanical symptoms.  I reviewed the past medical history, family history, social history, surgical history, and allergies today and no changes were needed.  Please see the problem list section below in epic for further details.  Past Medical History: Past Medical History:  Diagnosis Date  . Hyperlipidemia   . Hypertension   . Pacemaker syndrome    Past Surgical History: Past Surgical History:  Procedure Laterality Date  . BICEPS TENDON REPAIR  08/2007  . BREAST BIOPSY  12/1997  . CHOLECYSTECTOMY  05/2003  . TONSILLECTOMY AND ADENOIDECTOMY  1962   Social History: Social History   Socioeconomic History  . Marital status: Married    Spouse name: Not on file  . Number of children: Not on file  . Years of education: Not on file  . Highest education level: Not on file  Occupational History  . Not on file  Social Needs  . Financial resource strain: Not on file  . Food insecurity:    Worry: Not on file    Inability: Not on file  . Transportation needs:    Medical: Not on file    Non-medical: Not on file  Tobacco Use  . Smoking status: Former Smoker    Last attempt to quit: 03/26/2012    Years since quitting: 5.8  . Smokeless tobacco: Never Used  Substance and Sexual Activity  . Alcohol use: No  . Drug use: No  . Sexual activity: Yes    Partners: Male  Lifestyle  . Physical activity:    Days per week: Not on file    Minutes per session: Not on file  . Stress: Not on file  Relationships  . Social connections:    Talks on phone: Not on file    Gets together: Not on file    Attends religious service: Not on file    Active member of club  or organization: Not on file    Attends meetings of clubs or organizations: Not on file    Relationship status: Not on file  Other Topics Concern  . Not on file  Social History Narrative  . Not on file   Family History: Family History  Problem Relation Age of Onset  . Alcoholism Father   . Bladder Cancer Father   . Heart attack Father   . Hypertension Father   . Lung cancer Mother   . Leukemia Paternal Grandmother    Allergies: Allergies  Allergen Reactions  . Vibramycin [Doxycycline Calcium] Hives   Medications: See med rec.  Review of Systems: No fevers, chills, night sweats, weight loss, chest pain, or shortness of breath.   Objective:    General: Well Developed, well nourished, and in no acute distress.  Neuro: Alert and oriented x3, extra-ocular muscles intact, sensation grossly intact.  HEENT: Normocephalic, atraumatic, pupils equal round reactive to light, neck supple, no masses, no lymphadenopathy, thyroid nonpalpable.  Skin: Warm and dry, no rashes. Cardiac: Regular rate and rhythm, no murmurs rubs or gallops, no lower extremity edema.  Respiratory: Clear to auscultation bilaterally. Not using accessory muscles, speaking in full sentences. Left shoulder: Inspection reveals no abnormalities, atrophy or asymmetry. Palpation is normal with no tenderness over  AC joint or bicipital groove. ROM is full in all planes. Rotator cuff strength normal throughout. No signs of impingement with negative Neer and Hawkin's tests, empty can. Speeds and Yergason's tests normal. No labral pathology noted with negative Obrien's, positive crank, negative clunk, and good stability. Normal scapular function observed. No painful arc and no drop arm sign. No apprehension sign  Procedure: Real-time Ultrasound Guided Injection of left glenohumeral joint Device: GE Logiq E  Verbal informed consent obtained.  Time-out conducted.  Noted no overlying erythema, induration, or other signs  of local infection.  Skin prepped in a sterile fashion.  Local anesthesia: Topical Ethyl chloride.  With sterile technique and under real time ultrasound guidance: 1 cc Kenalog 40, 2 cc lidocaine, 2 cc bupivacaine injected easily Completed without difficulty  Pain immediately resolved suggesting accurate placement of the medication.  Advised to call if fevers/chills, erythema, induration, drainage, or persistent bleeding.  Images permanently stored and available for review in the ultrasound unit.  Impression: Technically successful ultrasound guided injection.  Impression and Recommendations:    Acute pain of left shoulder Suspected articular sided supraspinatus tear on humeral MRI, not the best sensitivity for shoulder pathology but we did do it for a suspected humeral lytic lesion. Symptoms are glenohumeral referrable. She is failed physical therapy/home rehab exercises, NSAIDs, tramadol. Glenohumeral injection as above, return in 1 month.  ___________________________________________ Ihor Austin. Benjamin Stain, M.D., ABFM., CAQSM. Primary Care and Sports Medicine [facility]  Adjunct Instructor of Family Medicine  [employer]

## 2018-01-25 NOTE — Assessment & Plan Note (Signed)
Suspected articular sided supraspinatus tear on humeral MRI, not the best sensitivity for shoulder pathology but we did do it for a suspected humeral lytic lesion. Symptoms are glenohumeral referrable. She is failed physical therapy/home rehab exercises, NSAIDs, tramadol. Glenohumeral injection as above, return in 1 month.

## 2018-01-25 NOTE — Telephone Encounter (Signed)
MRI was submitted via [employer]. [id number]:[id number]. Valid: 11/27/2017 -12/26/2017

## 2018-01-25 NOTE — Telephone Encounter (Signed)
Patient informed me that she received a letter from her insurance company stating that her MRI would not be covered by insurance because it was not authorized beforehand. Patient would like to know what needs to be done to fix this issue. Please advise. Thanks!

## 2018-01-31 ENCOUNTER — Encounter: Payer: Self-pay | Admitting: Sports Medicine

## 2018-02-01 ENCOUNTER — Ambulatory Visit (INDEPENDENT_AMBULATORY_CARE_PROVIDER_SITE_OTHER): Payer: BLUE CROSS/BLUE SHIELD | Admitting: Sports Medicine

## 2018-02-01 DIAGNOSIS — M25512 Pain in left shoulder: Secondary | ICD-10-CM | POA: Diagnosis not present

## 2018-02-01 NOTE — Progress Notes (Signed)
Subjective:    CC: Continued left shoulder pain  HPI: This is a very pleasant 57 year old female, I saw her sometime ago with left shoulder pain, imaging studies showed a questionable lytic lesion in her humerus so we proceeded with an MRI which was overall unremarkable, it did show a possible articular sided supraspinatus tear.  Because her pain was referrable to the glenohumeral joint we did an ultrasound-guided glenohumeral joint injection that provided only 1 day of relief.  She returns today with persistent pain, localized over the deltoid, worse with overhead activities, but also with abduction and external rotation.  Nothing radicular.  Moderate, persistent.  I reviewed the past medical history, family history, social history, surgical history, and allergies today and no changes were needed.  Please see the problem list section below in epic for further details.  Past Medical History: Past Medical History:  Diagnosis Date  . Hyperlipidemia   . Hypertension   . Pacemaker syndrome    Past Surgical History: Past Surgical History:  Procedure Laterality Date  . BICEPS TENDON REPAIR  08/2007  . BREAST BIOPSY  12/1997  . CHOLECYSTECTOMY  05/2003  . TONSILLECTOMY AND ADENOIDECTOMY  1962   Social History: Social History   Socioeconomic History  . Marital status: Married    Spouse name: Not on file  . Number of children: Not on file  . Years of education: Not on file  . Highest education level: Not on file  Occupational History  . Not on file  Social Needs  . Financial resource strain: Not on file  . Food insecurity:    Worry: Not on file    Inability: Not on file  . Transportation needs:    Medical: Not on file    Non-medical: Not on file  Tobacco Use  . Smoking status: Former Smoker    Last attempt to quit: 03/26/2012    Years since quitting: 5.8  . Smokeless tobacco: Never Used  Substance and Sexual Activity  . Alcohol use: No  . Drug use: No  . Sexual activity: Yes      Partners: Male  Lifestyle  . Physical activity:    Days per week: Not on file    Minutes per session: Not on file  . Stress: Not on file  Relationships  . Social connections:    Talks on phone: Not on file    Gets together: Not on file    Attends religious service: Not on file    Active member of club or organization: Not on file    Attends meetings of clubs or organizations: Not on file    Relationship status: Not on file  Other Topics Concern  . Not on file  Social History Narrative  . Not on file   Family History: Family History  Problem Relation Age of Onset  . Alcoholism Father   . Bladder Cancer Father   . Heart attack Father   . Hypertension Father   . Lung cancer Mother   . Leukemia Paternal Grandmother    Allergies: Allergies  Allergen Reactions  . Vibramycin [Doxycycline Calcium] Hives   Medications: See med rec.  Review of Systems: No fevers, chills, night sweats, weight loss, chest pain, or shortness of breath.   Objective:    General: Well Developed, well nourished, and in no acute distress.  Neuro: Alert and oriented x3, extra-ocular muscles intact, sensation grossly intact.  HEENT: Normocephalic, atraumatic, pupils equal round reactive to light, neck supple, no masses, no lymphadenopathy, thyroid  nonpalpable.  Skin: Warm and dry, no rashes. Cardiac: Regular rate and rhythm, no murmurs rubs or gallops, no lower extremity edema.  Respiratory: Clear to auscultation bilaterally. Not using accessory muscles, speaking in full sentences.  Procedure: Real-time Ultrasound Guided Injection of left subacromial bursa Device: GE Logiq E  Verbal informed consent obtained.  Time-out conducted.  Noted no overlying erythema, induration, or other signs of local infection.  Skin prepped in a sterile fashion.  Local anesthesia: Topical Ethyl chloride.  With sterile technique and under real time ultrasound guidance: 1 cc kenalog 40, 1 cc lidocaine, 1 cc bupivacaine  injected easily Completed without difficulty  Pain immediately resolved suggesting accurate placement of the medication.  Advised to call if fevers/chills, erythema, induration, drainage, or persistent bleeding.  Images permanently stored and available for review in the ultrasound unit.  Impression: Technically successful ultrasound guided injection.  Impression and Recommendations:    Acute pain of left shoulder Suspected articular sided supraspinatus tear on humeral MRI however this does not have the best sensitivity for shoulder pathology. Glenohumeral injection provided 1 day of relief. Subacromial injection at this time, if insufficient relief we will refer her for shoulder arthroscopy. ___________________________________________ Ihor Austinhomas J. Benjamin Stainhekkekandam, M.D., ABFM., CAQSM. Primary Care and Sports Medicine [facility]  Adjunct Instructor of Family Medicine  [employer]

## 2018-02-01 NOTE — Assessment & Plan Note (Signed)
Suspected articular sided supraspinatus tear on humeral MRI however this does not have the best sensitivity for shoulder pathology. Glenohumeral injection provided 1 day of relief. Subacromial injection at this time, if insufficient relief we will refer her for shoulder arthroscopy.

## 2018-02-09 ENCOUNTER — Encounter: Payer: Self-pay | Admitting: Physician Assistant

## 2018-02-09 DIAGNOSIS — G43009 Migraine without aura, not intractable, without status migrainosus: Secondary | ICD-10-CM

## 2018-02-09 DIAGNOSIS — I700 Atherosclerosis of aorta: Secondary | ICD-10-CM

## 2018-02-09 DIAGNOSIS — I10 Essential (primary) hypertension: Secondary | ICD-10-CM

## 2018-02-10 MED ORDER — BENAZEPRIL HCL 10 MG PO TABS: 10.0000 mg | ORAL_TABLET | Freq: Every day | ORAL | 0 refills | Status: DC

## 2018-02-10 MED ORDER — ATORVASTATIN CALCIUM 40 MG PO TABS
40.0000 mg | ORAL_TABLET | Freq: Every day | ORAL | 0 refills | Status: DC
Start: 2018-02-10 — End: 2018-11-03

## 2018-02-10 MED ORDER — TOPIRAMATE ER 100 MG PO CAP24: 1.0000 | ORAL_CAPSULE | Freq: Every day | ORAL | 0 refills | Status: DC

## 2018-02-10 NOTE — Telephone Encounter (Signed)
Ok to send 2 weeks worth.

## 2018-02-23 ENCOUNTER — Ambulatory Visit: Payer: BLUE CROSS/BLUE SHIELD | Admitting: Sports Medicine

## 2018-05-15 ENCOUNTER — Encounter: Payer: Self-pay | Admitting: Physician Assistant

## 2018-07-26 ENCOUNTER — Other Ambulatory Visit: Payer: Self-pay | Admitting: Physician Assistant

## 2018-07-26 DIAGNOSIS — Z1231 Encounter for screening mammogram for malignant neoplasm of breast: Secondary | ICD-10-CM

## 2018-08-11 ENCOUNTER — Ambulatory Visit (INDEPENDENT_AMBULATORY_CARE_PROVIDER_SITE_OTHER): Payer: BLUE CROSS/BLUE SHIELD

## 2018-08-11 DIAGNOSIS — Z1231 Encounter for screening mammogram for malignant neoplasm of breast: Secondary | ICD-10-CM

## 2018-08-12 NOTE — Progress Notes (Signed)
Normal mammogram. Follow up in 1 year.

## 2018-08-13 ENCOUNTER — Encounter: Payer: Self-pay | Admitting: Physician Assistant

## 2018-08-13 MED ORDER — DIAZEPAM 10 MG PO TABS: 5.0000 mg | ORAL_TABLET | Freq: Two times a day (BID) | ORAL | 1 refills | Status: DC | PRN

## 2018-08-24 ENCOUNTER — Other Ambulatory Visit: Payer: Self-pay | Admitting: Physician Assistant

## 2018-08-24 ENCOUNTER — Ambulatory Visit (INDEPENDENT_AMBULATORY_CARE_PROVIDER_SITE_OTHER): Payer: BLUE CROSS/BLUE SHIELD | Admitting: Sports Medicine

## 2018-08-24 ENCOUNTER — Encounter: Payer: Self-pay | Admitting: Sports Medicine

## 2018-08-24 ENCOUNTER — Ambulatory Visit (INDEPENDENT_AMBULATORY_CARE_PROVIDER_SITE_OTHER): Payer: BLUE CROSS/BLUE SHIELD

## 2018-08-24 DIAGNOSIS — M545 Low back pain, unspecified: Secondary | ICD-10-CM

## 2018-08-24 DIAGNOSIS — I700 Atherosclerosis of aorta: Secondary | ICD-10-CM

## 2018-08-24 DIAGNOSIS — M47816 Spondylosis without myelopathy or radiculopathy, lumbar region: Secondary | ICD-10-CM | POA: Insufficient documentation

## 2018-08-24 DIAGNOSIS — G43009 Migraine without aura, not intractable, without status migrainosus: Secondary | ICD-10-CM

## 2018-08-24 MED ORDER — PREDNISONE 50 MG PO TABS: ORAL_TABLET | ORAL | 0 refills | Status: DC

## 2018-08-24 NOTE — Assessment & Plan Note (Signed)
Right sided acute low back pain. Facetogenic versus right sacroiliac joint. 5 days of prednisone, x-rays today. SI joint rehab exercises given, if no better in 6 weeks the neck step is diagnostic and therapeutic sacroiliac joint injection versus right L5-S1 facet injection. 

## 2018-08-24 NOTE — Patient Instructions (Signed)
Right sided acute low back pain. Facetogenic versus right sacroiliac joint. 5 days of prednisone, x-rays today. SI joint rehab exercises given, if no better in 6 weeks the neck step is diagnostic and therapeutic sacroiliac joint injection versus right L5-S1 facet injection.

## 2018-08-24 NOTE — Progress Notes (Signed)
Subjective:    I'm seeing this patient as a consultation for: Tandy GawJade Breeback, PA-C  CC: Back and buttock pain  HPI: For the past few weeks this pleasant 57 year old female has had pain that she localizes in her right buttock.  Worse at night, overall okay during the day and with sitting, no pain with Valsalva, nothing radiating past the knee.  Localized deep in the buttock and near the right sacroiliac joint without radiation.  No dysfunction, saddle numbness, no constitutional symptoms, no trauma.  I reviewed the past medical history, family history, social history, surgical history, and allergies today and no changes were needed.  Please see the problem list section below in epic for further details.  Past Medical History: Past Medical History:  Diagnosis Date  . Hyperlipidemia   . Hypertension   . Pacemaker syndrome    Past Surgical History: Past Surgical History:  Procedure Laterality Date  . BICEPS TENDON REPAIR  08/2007  . BREAST BIOPSY  12/1997  . CHOLECYSTECTOMY  05/2003  . TONSILLECTOMY AND ADENOIDECTOMY  1962   Social History: Social History   Socioeconomic History  . Marital status: Married    Spouse name: Not on file  . Number of children: Not on file  . Years of education: Not on file  . Highest education level: Not on file  Occupational History  . Not on file  Social Needs  . Financial resource strain: Not on file  . Food insecurity:    Worry: Not on file    Inability: Not on file  . Transportation needs:    Medical: Not on file    Non-medical: Not on file  Tobacco Use  . Smoking status: Former Smoker    Last attempt to quit: 03/26/2012    Years since quitting: 6.4  . Smokeless tobacco: Never Used  Substance and Sexual Activity  . Alcohol use: No  . Drug use: No  . Sexual activity: Yes    Partners: Male  Lifestyle  . Physical activity:    Days per week: Not on file    Minutes per session: Not on file  . Stress: Not on file  Relationships  .  Social connections:    Talks on phone: Not on file    Gets together: Not on file    Attends religious service: Not on file    Active member of club or organization: Not on file    Attends meetings of clubs or organizations: Not on file    Relationship status: Not on file  Other Topics Concern  . Not on file  Social History Narrative  . Not on file   Family History: Family History  Problem Relation Age of Onset  . Alcoholism Father   . Bladder Cancer Father   . Heart attack Father   . Hypertension Father   . Lung cancer Mother   . Leukemia Paternal Grandmother    Allergies: Allergies  Allergen Reactions  . Vibramycin [Doxycycline Calcium] Hives   Medications: See med rec.  Review of Systems: No headache, visual changes, nausea, vomiting, diarrhea, constipation, dizziness, abdominal pain, skin rash, fevers, chills, night sweats, weight loss, swollen lymph nodes, body aches, joint swelling, muscle aches, chest pain, shortness of breath, mood changes, visual or auditory hallucinations.   Objective:   General: Well Developed, well nourished, and in no acute distress.  Neuro:  Extra-ocular muscles intact, able to move all 4 extremities, sensation grossly intact.  Deep tendon reflexes tested were normal. Psych: Alert and  oriented, mood congruent with affect. ENT:  Ears and nose appear unremarkable.  Hearing grossly normal. Neck: Unremarkable overall appearance, trachea midline.  No visible thyroid enlargement. Eyes: Conjunctivae and lids appear unremarkable.  Pupils equal and round. Skin: Warm and dry, no rashes noted.  Cardiovascular: Pulses palpable, no extremity edema. Back Exam:  Inspection: Unremarkable  Motion: Flexion 45 deg, Extension 45 deg, Side Bending to 45 deg bilaterally,  Rotation to 45 deg bilaterally  SLR laying: Negative  XSLR laying: Negative  Palpable tenderness: Right sacroiliac joint. FABER: negative. Sensory change: Gross sensation intact to all lumbar  and sacral dermatomes.  Reflexes: 2+ at both patellar tendons, 2+ at achilles tendons, Babinski's downgoing.  Strength at foot  Plantar-flexion: 5/5 Dorsi-flexion: 5/5 Eversion: 5/5 Inversion: 5/5  Leg strength  Quad: 5/5 Hamstring: 5/5 Hip flexor: 5/5 Hip abductors: 5/5  Gait unremarkable.  I reviewed a CT of the abdomen and pelvis from 2015, she does have a right L5-S1 facet joint arthrosis.  Mild bilateral sacroiliac joint degenerative changes.  Impression and Recommendations:   This case required medical decision making of moderate complexity.  Acute low back pain Right sided acute low back pain. Facetogenic versus right sacroiliac joint. 5 days of prednisone, x-rays today. SI joint rehab exercises given, if no better in 6 weeks the neck step is diagnostic and therapeutic sacroiliac joint injection versus right L5-S1 facet injection. ___________________________________________ Ihor Austinhomas J. Benjamin Stainhekkekandam, M.D., ABFM., CAQSM. Primary Care and Sports Medicine [facility]  Adjunct Professor of Family Medicine  [employer]

## 2018-09-28 IMAGING — MG DIGITAL SCREENING BILATERAL MAMMOGRAM WITH CAD
4 series · 4 of 4 positions shown · non-contrast
Comparison: Previous exam(s).

CLINICAL DATA: Screening.

EXAM:
DIGITAL SCREENING BILATERAL MAMMOGRAM WITH CAD

[R CC]
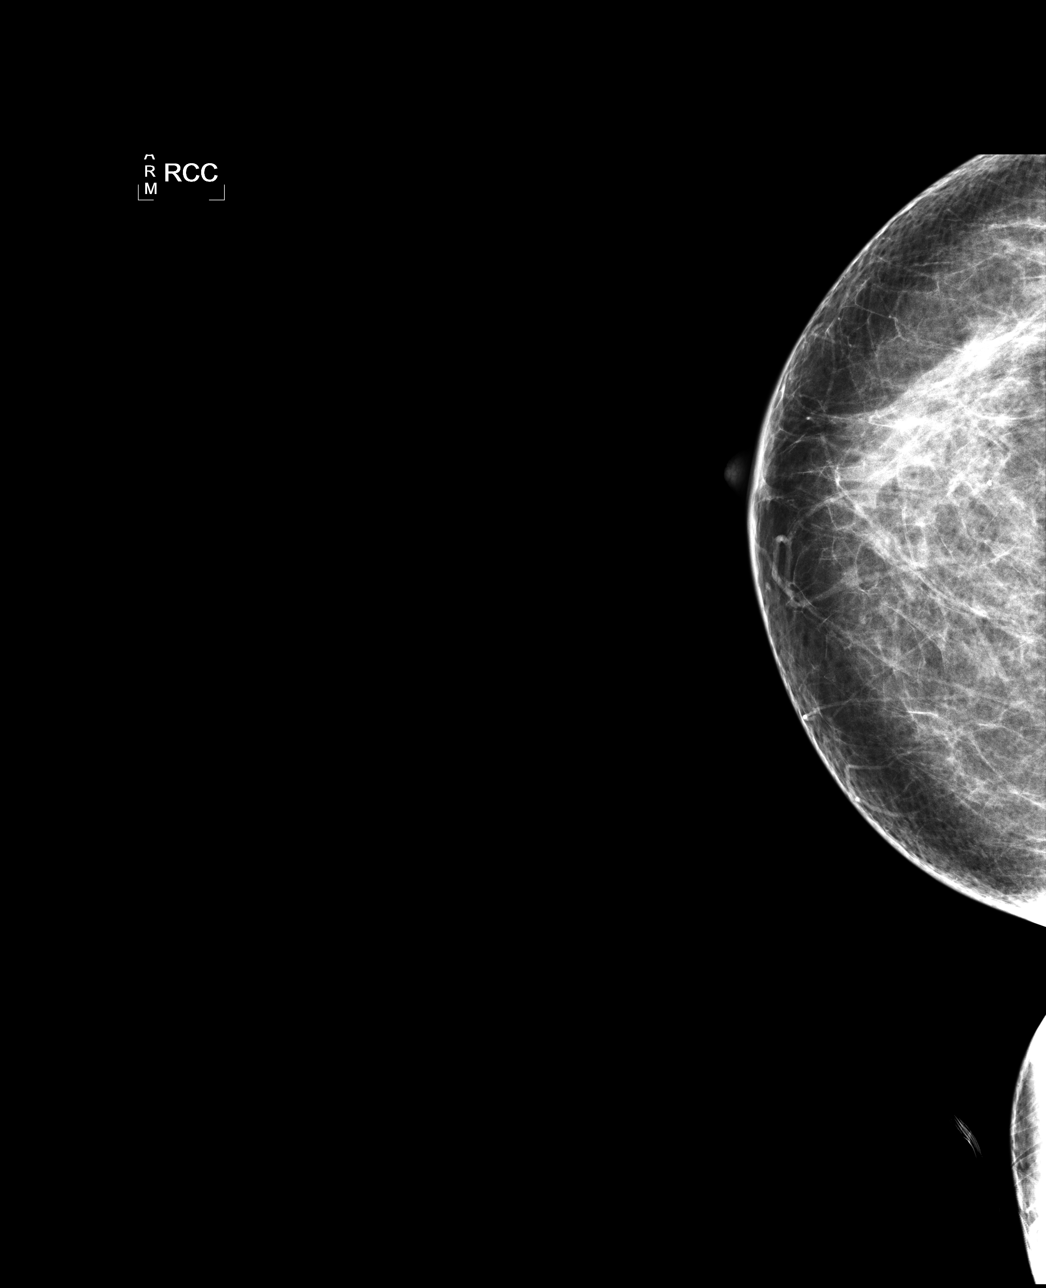

[L CC]
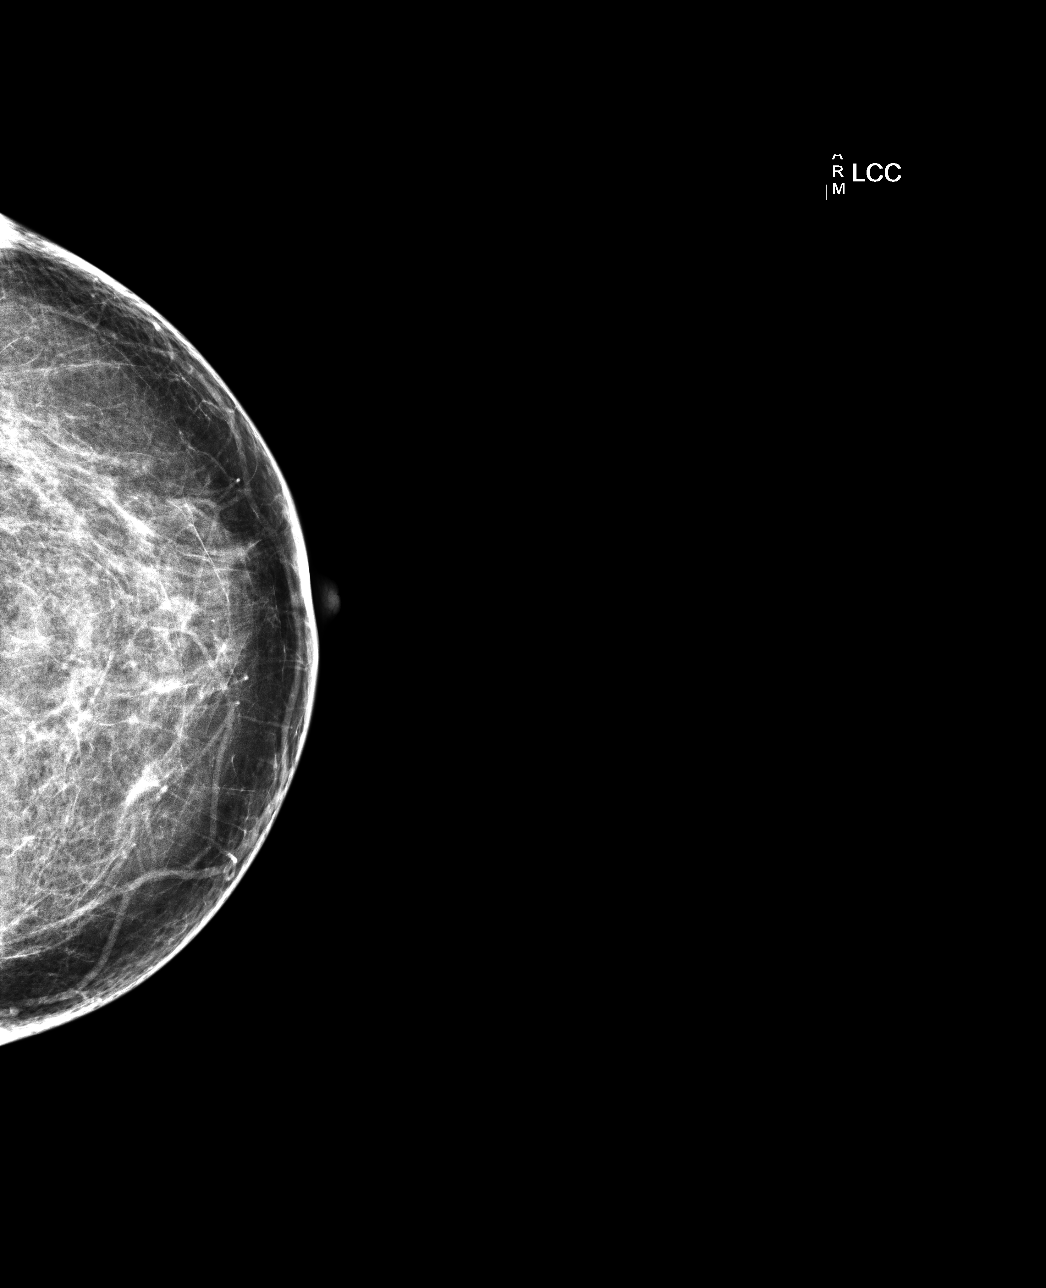

[L MLO]
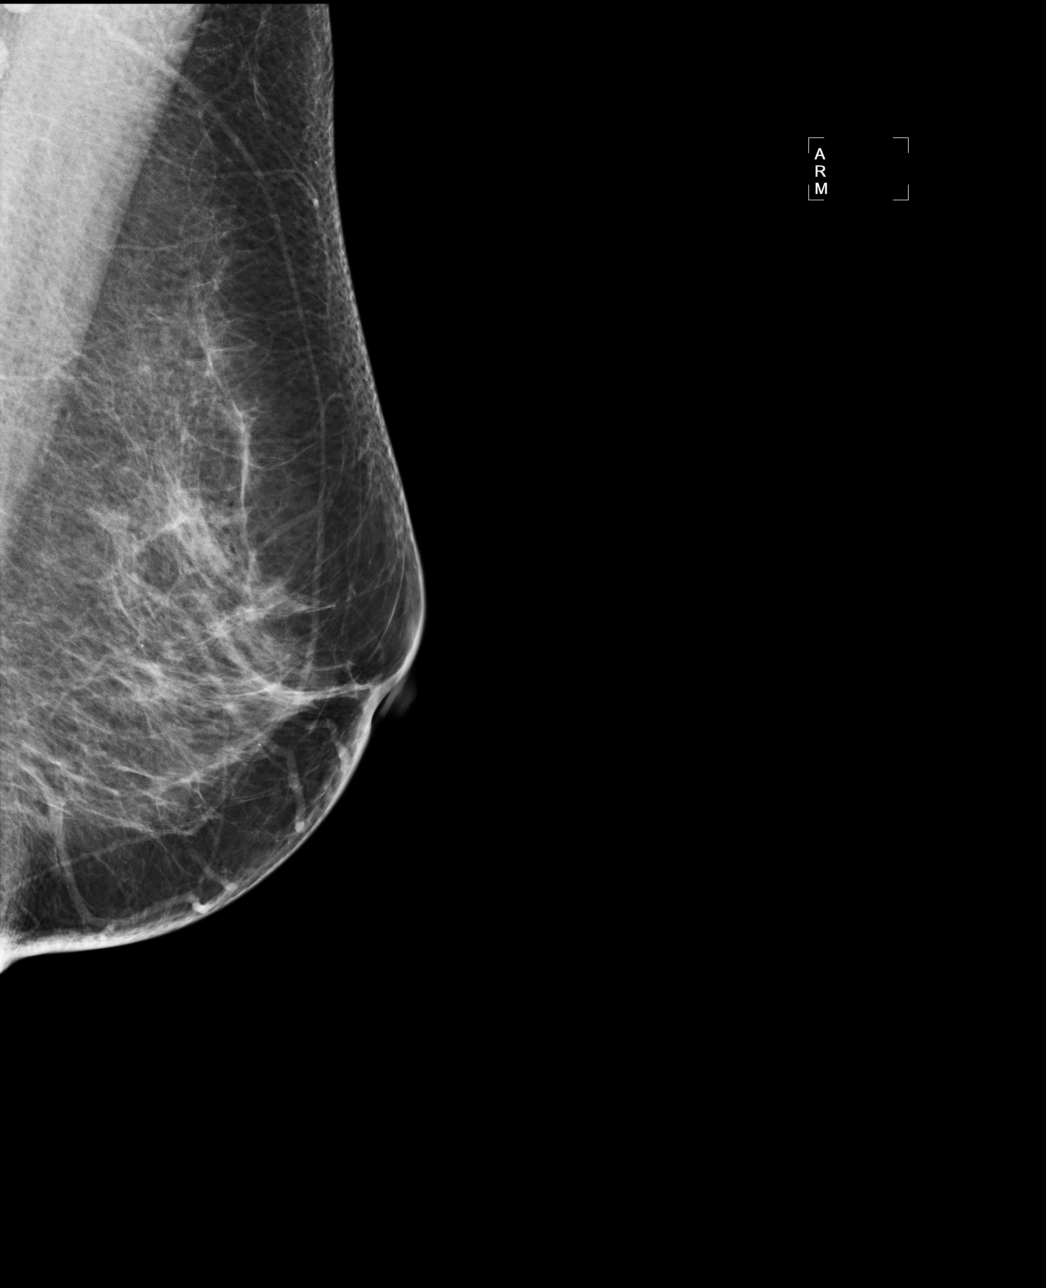

[R MLO]
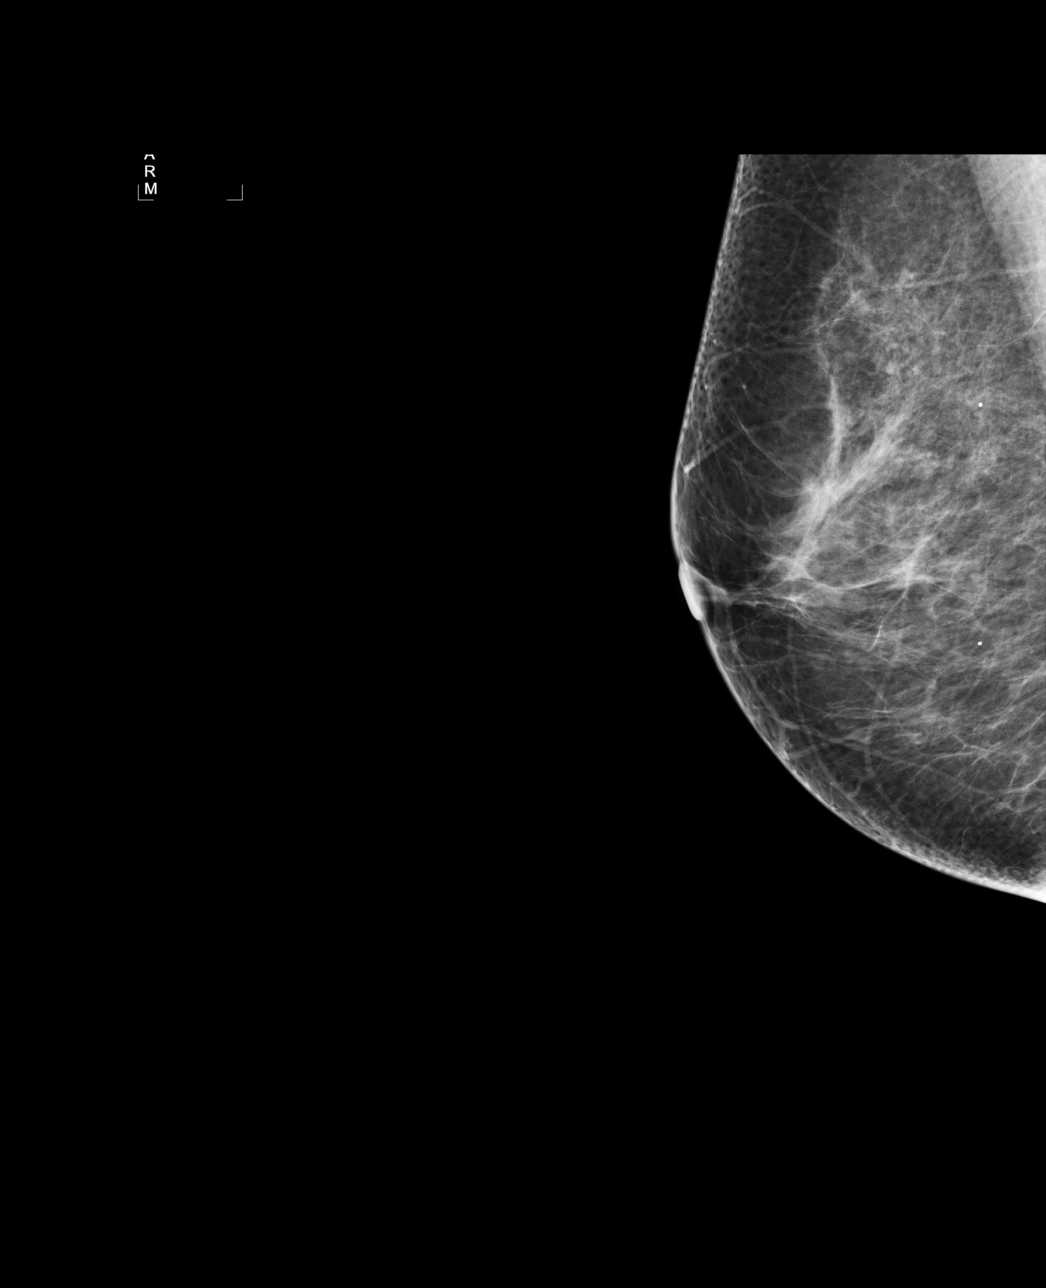

[4 of 4 positions shown; findings below may reference images not displayed]

ACR Breast Density Category b: There are scattered areas of
fibroglandular density.
FINDINGS: There are no findings suspicious for malignancy. Images were
processed with CAD.
IMPRESSION: No mammographic evidence of malignancy. A result letter of this
screening mammogram will be mailed directly to the patient.

RECOMMENDATION:
Screening mammogram in one year. (Code:AS-G-LCT)

BI-RADS CATEGORY  1: Negative.

## 2018-09-29 ENCOUNTER — Encounter: Payer: Self-pay | Admitting: Physician Assistant

## 2018-09-29 DIAGNOSIS — I10 Essential (primary) hypertension: Secondary | ICD-10-CM

## 2018-10-01 MED ORDER — BENAZEPRIL HCL 10 MG PO TABS: 10.0000 mg | ORAL_TABLET | Freq: Every day | ORAL | 0 refills | Status: DC

## 2018-10-26 ENCOUNTER — Other Ambulatory Visit: Payer: Self-pay | Admitting: Physician Assistant

## 2018-10-26 DIAGNOSIS — G43009 Migraine without aura, not intractable, without status migrainosus: Secondary | ICD-10-CM

## 2018-11-03 ENCOUNTER — Other Ambulatory Visit: Payer: Self-pay

## 2018-11-03 ENCOUNTER — Telehealth: Payer: Self-pay | Admitting: Physician Assistant

## 2018-11-03 ENCOUNTER — Encounter: Payer: Self-pay | Admitting: Physician Assistant

## 2018-11-03 ENCOUNTER — Ambulatory Visit (INDEPENDENT_AMBULATORY_CARE_PROVIDER_SITE_OTHER): Payer: BLUE CROSS/BLUE SHIELD | Admitting: Physician Assistant

## 2018-11-03 VITALS — BP 139/69 | HR 61 | Temp 98.2°F | Ht 64.0 in | Wt 136.0 lb

## 2018-11-03 DIAGNOSIS — Z13220 Encounter for screening for lipoid disorders: Secondary | ICD-10-CM

## 2018-11-03 DIAGNOSIS — I10 Essential (primary) hypertension: Secondary | ICD-10-CM

## 2018-11-03 DIAGNOSIS — Z0000 Encounter for general adult medical examination without abnormal findings: Secondary | ICD-10-CM

## 2018-11-03 DIAGNOSIS — I700 Atherosclerosis of aorta: Secondary | ICD-10-CM

## 2018-11-03 DIAGNOSIS — Z131 Encounter for screening for diabetes mellitus: Secondary | ICD-10-CM

## 2018-11-03 DIAGNOSIS — G43009 Migraine without aura, not intractable, without status migrainosus: Secondary | ICD-10-CM | POA: Diagnosis not present

## 2018-11-03 DIAGNOSIS — Z1159 Encounter for screening for other viral diseases: Secondary | ICD-10-CM

## 2018-11-03 DIAGNOSIS — Z1329 Encounter for screening for other suspected endocrine disorder: Secondary | ICD-10-CM

## 2018-11-03 DIAGNOSIS — Z130 Encounter for screening for diseases of the blood and blood-forming organs and certain disorders involving the immune mechanism: Secondary | ICD-10-CM

## 2018-11-03 MED ORDER — ATORVASTATIN CALCIUM 40 MG PO TABS: 40.0000 mg | ORAL_TABLET | Freq: Every day | ORAL | 3 refills | Status: DC

## 2018-11-03 MED ORDER — BENAZEPRIL HCL 10 MG PO TABS: 10.0000 mg | ORAL_TABLET | Freq: Every day | ORAL | 3 refills | Status: DC

## 2018-11-03 MED ORDER — TOPIRAMATE ER 100 MG PO CAP24: 1.0000 | ORAL_CAPSULE | Freq: Every day | ORAL | 3 refills | Status: DC

## 2018-11-03 MED ORDER — SUMATRIPTAN SUCCINATE 50 MG PO TABS: ORAL_TABLET | ORAL | 5 refills | Status: DC

## 2018-11-03 NOTE — Telephone Encounter (Signed)
Digestive health colonoscopy.

## 2018-11-03 NOTE — Telephone Encounter (Signed)
No need was in Epic.

## 2018-11-03 NOTE — Progress Notes (Signed)
Subjective:     Rebecca Berry is a 58 y.o. female and is here for a comprehensive physical exam. The patient reports no problems.  Social History   Socioeconomic History  . Marital status: Married    Spouse name: Not on file  . Number of children: Not on file  . Years of education: Not on file  . Highest education level: Not on file  Occupational History  . Not on file  Social Needs  . Financial resource strain: Not on file  . Food insecurity:    Worry: Not on file    Inability: Not on file  . Transportation needs:    Medical: Not on file    Non-medical: Not on file  Tobacco Use  . Smoking status: Former Smoker    Last attempt to quit: 03/26/2012    Years since quitting: 6.6  . Smokeless tobacco: Never Used  Substance and Sexual Activity  . Alcohol use: No  . Drug use: No  . Sexual activity: Yes    Partners: Male  Lifestyle  . Physical activity:    Days per week: Not on file    Minutes per session: Not on file  . Stress: Not on file  Relationships  . Social connections:    Talks on phone: Not on file    Gets together: Not on file    Attends religious service: Not on file    Active member of club or organization: Not on file    Attends meetings of clubs or organizations: Not on file    Relationship status: Not on file  . Intimate partner violence:    Fear of current or ex partner: Not on file    Emotionally abused: Not on file    Physically abused: Not on file    Forced sexual activity: Not on file  Other Topics Concern  . Not on file  Social History Narrative  . Not on file   Health Maintenance  Topic Date Due  . Hepatitis C Screening  Nov 18, 1960  . HIV Screening  02/24/1975  . INFLUENZA VACCINE  11/23/2018 (Originally 03/25/2018)  . PAP SMEAR-Modifier  06/19/2019  . MAMMOGRAM  08/11/2020  . COLONOSCOPY  08/25/2021  . TETANUS/TDAP  05/26/2023    The following portions of the patient's history were reviewed and updated as appropriate: allergies,  current medications, past family history, past medical history, past social history, past surgical history and problem list.  Review of Systems A comprehensive review of systems was negative.   Objective:    BP 139/69   Pulse 61   Temp 98.2 F (36.8 C) (Oral)   Ht 5\' 4"  (1.626 m)   Wt 136 lb (61.7 kg)   BMI 23.34 kg/m  General appearance: alert, cooperative and appears stated age Head: Normocephalic, without obvious abnormality, atraumatic Eyes: conjunctivae/corneas clear. PERRL, EOM's intact. Fundi benign. Ears: normal TM's and external ear canals both ears Nose: Nares normal. Septum midline. Mucosa normal. No drainage or sinus tenderness. Throat: lips, mucosa, and tongue normal; teeth and gums normal Neck: no adenopathy, no carotid bruit, no JVD, supple, symmetrical, trachea midline and thyroid not enlarged, symmetric, no tenderness/mass/nodules Back: symmetric, no curvature. ROM normal. No CVA tenderness. Lungs: clear to auscultation bilaterally Heart: regular rate and rhythm, S1, S2 normal, no murmur, click, rub or gallop Abdomen: soft, non-tender; bowel sounds normal; no masses,  no organomegaly Extremities: extremities normal, atraumatic, no cyanosis or edema Pulses: 2+ and symmetric Skin: Skin color, texture, turgor normal. No rashes  or lesions Lymph nodes: Cervical, supraclavicular, and axillary nodes normal. Neurologic: Alert and oriented X 3, normal strength and tone. Normal symmetric reflexes. Normal coordination and gait    Assessment:    Healthy female exam.      Plan:     Marland KitchenMarland KitchenSabirin was seen today for annual exam.  Diagnoses and all orders for this visit:  Routine physical examination  Migraine without aura and without status migrainosus, not intractable -     Topiramate ER (TROKENDI XR) 100 MG CP24; Take 1 capsule by mouth at bedtime. -     SUMAtriptan (IMITREX) 50 MG tablet; TAKE ONE TABLET BY MOUTH EVERY 2 HOURS AS NEEDED FOR MIGRAINE OR HEADACHE. MAY  REPEAT IN 2 HOURS IF HEADACHE PERSISTS OR RECURS.  Essential hypertension, benign -     benazepril (LOTENSIN) 10 MG tablet; Take 1 tablet (10 mg total) by mouth daily.  Aortic atherosclerosis (HCC) -     atorvastatin (LIPITOR) 40 MG tablet; Take 1 tablet (40 mg total) by mouth daily.  Need for hepatitis C screening test -     Hepatitis C Antibody   .[name] Depression screen [facility] 2/9 11/03/2018 10/02/2017 05/19/2017 12/17/2016  Decreased Interest 0 0 0 0  Down, Depressed, Hopeless 0 0 0 0  PHQ - 2 Score 0 0 0 0  Altered sleeping 0 - - -  Tired, decreased energy 0 - - -  Change in appetite 0 - - -  Feeling bad or failure about yourself  0 - - -  Trouble concentrating 0 - - -  Moving slowly or fidgety/restless 0 - - -  Suicidal thoughts 0 - - -  PHQ-9 Score 0 - - -  Difficult doing work/chores Not difficult at all - - -   .[name] Discussed 150 minutes of exercise a week.  Encouraged vitamin D 1000 units and Calcium 1300mg  or 4 servings of dairy a day.  Vaccines up to date.  Hep C ordered.  Will try to locate colonoscopy. Found in EMR. Will abstract. Up to date.  Mammogram up to date. Pap up to date.   BP looks great. Medications refilled.   See After Visit Summary for Counseling Recommendations

## 2018-11-03 NOTE — Patient Instructions (Addendum)
Tumeric for anti-inflammation.  NSAIDs as needed.  Regular exercise.  Vitamin D 1000 units and 4 serviings of calcium or 1351m.   Health Maintenance for Postmenopausal Women Menopause is a normal process in which your reproductive ability comes to an end. This process happens gradually over a span of months to years, usually between the ages of 596and 514 Menopause is complete when you have missed 12 consecutive menstrual periods. It is important to talk with your health care provider about some of the most common conditions that affect postmenopausal women, such as heart disease, cancer, and bone loss (osteoporosis). Adopting a healthy lifestyle and getting preventive care can help to promote your health and wellness. Those actions can also lower your chances of developing some of these common conditions. What should I know about menopause? During menopause, you may experience a number of symptoms, such as:  Moderate-to-severe hot flashes.  Night sweats.  Decrease in sex drive.  Mood swings.  Headaches.  Tiredness.  Irritability.  Memory problems.  Insomnia. Choosing to treat or not to treat menopausal changes is an individual decision that you make with your health care provider. What should I know about hormone replacement therapy and supplements? Hormone therapy products are effective for treating symptoms that are associated with menopause, such as hot flashes and night sweats. Hormone replacement carries certain risks, especially as you become older. If you are thinking about using estrogen or estrogen with progestin treatments, discuss the benefits and risks with your health care provider. What should I know about heart disease and stroke? Heart disease, heart attack, and stroke become more likely as you age. This may be due, in part, to the hormonal changes that your body experiences during menopause. These can affect how your body processes dietary fats, triglycerides, and  cholesterol. Heart attack and stroke are both medical emergencies. There are many things that you can do to help prevent heart disease and stroke:  Have your blood pressure checked at least every 1-2 years. High blood pressure causes heart disease and increases the risk of stroke.  If you are 593273years old, ask your health care provider if you should take aspirin to prevent a heart attack or a stroke.  Do not use any tobacco products, including cigarettes, chewing tobacco, or electronic cigarettes. If you need help quitting, ask your health care provider.  It is important to eat a healthy diet and maintain a healthy weight. ? Be sure to include plenty of vegetables, fruits, low-fat dairy products, and lean protein. ? Avoid eating foods that are high in solid fats, added sugars, or salt (sodium).  Get regular exercise. This is one of the most important things that you can do for your health. ? Try to exercise for at least 150 minutes each week. The type of exercise that you do should increase your heart rate and make you sweat. This is known as moderate-intensity exercise. ? Try to do strengthening exercises at least twice each week. Do these in addition to the moderate-intensity exercise.  Know your numbers.Ask your health care provider to check your cholesterol and your blood glucose. Continue to have your blood tested as directed by your health care provider.  What should I know about cancer screening? There are several types of cancer. Take the following steps to reduce your risk and to catch any cancer development as early as possible. Breast Cancer  Practice breast self-awareness. ? This means understanding how your breasts normally appear and feel. ? It also  means doing regular breast self-exams. Let your health care provider know about any changes, no matter how small.  If you are 59 or older, have a clinician do a breast exam (clinical breast exam or CBE) every year. Depending  on your age, family history, and medical history, it may be recommended that you also have a yearly breast X-ray (mammogram).  If you have a family history of breast cancer, talk with your health care provider about genetic screening.  If you are at high risk for breast cancer, talk with your health care provider about having an MRI and a mammogram every year.  Breast cancer (BRCA) gene test is recommended for women who have family members with BRCA-related cancers. Results of the assessment will determine the need for genetic counseling and BRCA1 and for BRCA2 testing. BRCA-related cancers include these types: ? Breast. This occurs in males or females. ? Ovarian. ? Tubal. This may also be called fallopian tube cancer. ? Cancer of the abdominal or pelvic lining (peritoneal cancer). ? Prostate. ? Pancreatic. Cervical, Uterine, and Ovarian Cancer Your health care provider may recommend that you be screened regularly for cancer of the pelvic organs. These include your ovaries, uterus, and vagina. This screening involves a pelvic exam, which includes checking for microscopic changes to the surface of your cervix (Pap test).  For women ages 21-65, health care providers may recommend a pelvic exam and a Pap test every three years. For women ages 59-65, they may recommend the Pap test and pelvic exam, combined with testing for human papilloma virus (HPV), every five years. Some types of HPV increase your risk of cervical cancer. Testing for HPV may also be done on women of any age who have unclear Pap test results.  Other health care providers may not recommend any screening for nonpregnant women who are considered low risk for pelvic cancer and have no symptoms. Ask your health care provider if a screening pelvic exam is right for you.  If you have had past treatment for cervical cancer or a condition that could lead to cancer, you need Pap tests and screening for cancer for at least 20 years after  your treatment. If Pap tests have been discontinued for you, your risk factors (such as having a new sexual partner) need to be reassessed to determine if you should start having screenings again. Some women have medical problems that increase the chance of getting cervical cancer. In these cases, your health care provider may recommend that you have screening and Pap tests more often.  If you have a family history of uterine cancer or ovarian cancer, talk with your health care provider about genetic screening.  If you have vaginal bleeding after reaching menopause, tell your health care provider.  There are currently no reliable tests available to screen for ovarian cancer. Lung Cancer Lung cancer screening is recommended for adults 33-12 years old who are at high risk for lung cancer because of a history of smoking. A yearly low-dose CT scan of the lungs is recommended if you:  Currently smoke.  Have a history of at least 30 pack-years of smoking and you currently smoke or have quit within the past 15 years. A pack-year is smoking an average of one pack of cigarettes per day for one year. Yearly screening should:  Continue until it has been 15 years since you quit.  Stop if you develop a health problem that would prevent you from having lung cancer treatment. Colorectal Cancer  This  type of cancer can be detected and can often be prevented.  Routine colorectal cancer screening usually begins at age 75 and continues through age 16.  If you have risk factors for colon cancer, your health care provider may recommend that you be screened at an earlier age.  If you have a family history of colorectal cancer, talk with your health care provider about genetic screening.  Your health care provider may also recommend using home test kits to check for hidden blood in your stool.  A small camera at the end of a tube can be used to examine your colon directly (sigmoidoscopy or colonoscopy). This  is done to check for the earliest forms of colorectal cancer.  Direct examination of the colon should be repeated every 5-10 years until age 7. However, if early forms of precancerous polyps or small growths are found or if you have a family history or genetic risk for colorectal cancer, you may need to be screened more often. Skin Cancer  Check your skin from head to toe regularly.  Monitor any moles. Be sure to tell your health care provider: ? About any new moles or changes in moles, especially if there is a change in a mole's shape or color. ? If you have a mole that is larger than the size of a pencil eraser.  If any of your family members has a history of skin cancer, especially at a young age, talk with your health care provider about genetic screening.  Always use sunscreen. Apply sunscreen liberally and repeatedly throughout the day.  Whenever you are outside, protect yourself by wearing long sleeves, pants, a wide-brimmed hat, and sunglasses. What should I know about osteoporosis? Osteoporosis is a condition in which bone destruction happens more quickly than new bone creation. After menopause, you may be at an increased risk for osteoporosis. To help prevent osteoporosis or the bone fractures that can happen because of osteoporosis, the following is recommended:  If you are 68-102 years old, get at least 1,000 mg of calcium and at least 600 mg of vitamin D per day.  If you are older than age 46 but younger than age 36, get at least 1,200 mg of calcium and at least 600 mg of vitamin D per day.  If you are older than age 24, get at least 1,200 mg of calcium and at least 800 mg of vitamin D per day. Smoking and excessive alcohol intake increase the risk of osteoporosis. Eat foods that are rich in calcium and vitamin D, and do weight-bearing exercises several times each week as directed by your health care provider. What should I know about how menopause affects my mental health?  Depression may occur at any age, but it is more common as you become older. Common symptoms of depression include:  Low or sad mood.  Changes in sleep patterns.  Changes in appetite or eating patterns.  Feeling an overall lack of motivation or enjoyment of activities that you previously enjoyed.  Frequent crying spells. Talk with your health care provider if you think that you are experiencing depression. What should I know about immunizations? It is important that you get and maintain your immunizations. These include:  Tetanus, diphtheria, and pertussis (Tdap) booster vaccine.  Influenza every year before the flu season begins.  Pneumonia vaccine.  Shingles vaccine. Your health care provider may also recommend other immunizations. This information is not intended to replace advice given to you by your health care provider. Make sure  you discuss any questions you have with your health care provider. Document Released: 10/03/2005 Document Revised: 02/29/2016 Document Reviewed: 05/15/2015 Elsevier Interactive Patient Education  2019 [employer].

## 2018-11-04 LAB — CBC WITH DIFFERENTIAL/PLATELET
Absolute Monocytes: 347 cells/uL (ref 200–950)
Basophils Absolute: 20 cells/uL (ref 0–200)
Basophils Relative: 0.4 %
Eosinophils Absolute: 82 cells/uL (ref 15–500)
Eosinophils Relative: 1.6 %
HCT: 37.3 % (ref 35.0–45.0)
Hemoglobin: 12.5 g/dL (ref 11.7–15.5)
Lymphs Abs: 1545 cells/uL (ref 850–3900)
MCH: 29.9 pg (ref 27.0–33.0)
MCHC: 33.5 g/dL (ref 32.0–36.0)
MCV: 89.2 fL (ref 80.0–100.0)
MPV: 11.9 fL (ref 7.5–12.5)
Monocytes Relative: 6.8 %
NEUTROS PCT: 60.9 %
Neutro Abs: 3106 cells/uL (ref 1500–7800)
Platelets: 270 10*3/uL (ref 140–400)
RBC: 4.18 10*6/uL (ref 3.80–5.10)
RDW: 12.5 % (ref 11.0–15.0)
Total Lymphocyte: 30.3 %
WBC: 5.1 10*3/uL (ref 3.8–10.8)

## 2018-11-04 LAB — LIPID PANEL W/REFLEX DIRECT LDL
Cholesterol: 123 mg/dL (ref <200)
HDL: 61 mg/dL (ref >=50)
LDL Cholesterol (Calc): 47 mg/dL (calc)
Non-HDL Cholesterol (Calc): 62 mg/dL (calc) (ref <130)
Total CHOL/HDL Ratio: 2 (calc) (ref <5.0)
Triglycerides: 73 mg/dL (ref <150)

## 2018-11-04 LAB — COMPLETE METABOLIC PANEL WITH GFR
AG Ratio: 2.2 (calc) (ref 1.0–2.5)
ALT: 25 U/L (ref 6–29)
AST: 24 U/L (ref 10–35)
Albumin: 4.3 g/dL (ref 3.6–5.1)
Alkaline phosphatase (APISO): 74 U/L (ref 37–153)
BILIRUBIN TOTAL: 1 mg/dL (ref 0.2–1.2)
BUN: 19 mg/dL (ref 7–25)
CO2: 25 mmol/L (ref 20–32)
Calcium: 9.4 mg/dL (ref 8.6–10.4)
Chloride: 111 mmol/L — ABNORMAL HIGH (ref 98–110)
Creat: 0.86 mg/dL (ref 0.50–1.05)
GFR, Est African American: 86 mL/min/{1.73_m2} (ref >=60)
GFR, Est Non African American: 74 mL/min/{1.73_m2} (ref >=60)
Globulin: 2 g/dL (calc) (ref 1.9–3.7)
Glucose, Bld: 84 mg/dL (ref 65–99)
Potassium: 3.9 mmol/L (ref 3.5–5.3)
SODIUM: 144 mmol/L (ref 135–146)
Total Protein: 6.3 g/dL (ref 6.1–8.1)

## 2018-11-04 LAB — TSH: TSH: 1.7 mIU/L (ref 0.40–4.50)

## 2018-11-05 NOTE — Progress Notes (Signed)
Call pt: thyroid looks great. Cholesterol looks great. Kidney, liver, glucose look wonderful!

## 2018-11-09 ENCOUNTER — Encounter: Payer: Self-pay | Admitting: Sports Medicine

## 2018-11-09 ENCOUNTER — Other Ambulatory Visit: Payer: Self-pay

## 2018-11-09 ENCOUNTER — Ambulatory Visit (INDEPENDENT_AMBULATORY_CARE_PROVIDER_SITE_OTHER): Payer: BLUE CROSS/BLUE SHIELD | Admitting: Sports Medicine

## 2018-11-09 DIAGNOSIS — M75102 Unspecified rotator cuff tear or rupture of left shoulder, not specified as traumatic: Secondary | ICD-10-CM | POA: Diagnosis not present

## 2018-11-09 DIAGNOSIS — M545 Low back pain, unspecified: Secondary | ICD-10-CM

## 2018-11-09 NOTE — Assessment & Plan Note (Signed)
Previous injection was 7 months ago, repeat today. She is under rehabbed. She needs to do the rehab exercises and. If insufficient relief we will proceed with formal PT.

## 2018-11-09 NOTE — Progress Notes (Signed)
Subjective:    CC: Left shoulder pain, back pain  HPI: This is a pleasant 58 year old female, approximately 7 months ago we injected her left subacromial bursa, she did extremely well until now.  She has really not been doing her rehab exercises.  Now having worsening pain, moderate, persistent, localized anteriorly, over the deltoid and worse with overhead activities.  We also treated her for right sacroiliac dysfunction, she did really well with a burst of prednisone and sacroiliac joint rehab exercises, now having a recurrence of pain.  No bowel or bladder dysfunction, saddle numbness, constitutional symptoms.  I reviewed the past medical history, family history, social history, surgical history, and allergies today and no changes were needed.  Please see the problem list section below in epic for further details.  Past Medical History: Past Medical History:  Diagnosis Date  . Hyperlipidemia   . Hypertension   . Pacemaker syndrome    Past Surgical History: Past Surgical History:  Procedure Laterality Date  . BICEPS TENDON REPAIR  08/2007  . BREAST BIOPSY  12/1997  . CHOLECYSTECTOMY  05/2003  . TONSILLECTOMY AND ADENOIDECTOMY  1962   Social History: Social History   Socioeconomic History  . Marital status: Married    Spouse name: Not on file  . Number of children: Not on file  . Years of education: Not on file  . Highest education level: Not on file  Occupational History  . Not on file  Social Needs  . Financial resource strain: Not on file  . Food insecurity:    Worry: Not on file    Inability: Not on file  . Transportation needs:    Medical: Not on file    Non-medical: Not on file  Tobacco Use  . Smoking status: Former Smoker    Last attempt to quit: 03/26/2012    Years since quitting: 6.6  . Smokeless tobacco: Never Used  Substance and Sexual Activity  . Alcohol use: No  . Drug use: No  . Sexual activity: Yes    Partners: Male  Lifestyle  . Physical  activity:    Days per week: Not on file    Minutes per session: Not on file  . Stress: Not on file  Relationships  . Social connections:    Talks on phone: Not on file    Gets together: Not on file    Attends religious service: Not on file    Active member of club or organization: Not on file    Attends meetings of clubs or organizations: Not on file    Relationship status: Not on file  Other Topics Concern  . Not on file  Social History Narrative  . Not on file   Family History: Family History  Problem Relation Age of Onset  . Alcoholism Father   . Bladder Cancer Father   . Heart attack Father   . Hypertension Father   . Lung cancer Mother   . Leukemia Paternal Grandmother    Allergies: Allergies  Allergen Reactions  . Vibramycin [Doxycycline Calcium] Hives   Medications: See med rec.  Review of Systems: No fevers, chills, night sweats, weight loss, chest pain, or shortness of breath.   Objective:    General: Well Developed, well nourished, and in no acute distress.  Neuro: Alert and oriented x3, extra-ocular muscles intact, sensation grossly intact.  HEENT: Normocephalic, atraumatic, pupils equal round reactive to light, neck supple, no masses, no lymphadenopathy, thyroid nonpalpable.  Skin: Warm and dry, no rashes. Cardiac:  Regular rate and rhythm, no murmurs rubs or gallops, no lower extremity edema.  Respiratory: Clear to auscultation bilaterally. Not using accessory muscles, speaking in full sentences. Left shoulder: Inspection reveals no abnormalities, atrophy or asymmetry. Palpation is normal with no tenderness over AC joint or bicipital groove. ROM is full in all planes. Rotator cuff strength normal throughout. Positive Neer and Hawkin's tests, empty can. Speeds and Yergason's tests normal. No labral pathology noted with negative Obrien's, negative crank, negative clunk, and good stability. Normal scapular function observed. No painful arc and no drop arm  sign. No apprehension sign  Procedure: Real-time Ultrasound Guided injection of the left subacromial bursa Device: GE Logiq E  Verbal informed consent obtained.  Time-out conducted.  Noted no overlying erythema, induration, or other signs of local infection.  Skin prepped in a sterile fashion.  Local anesthesia: Topical Ethyl chloride.  With sterile technique and under real time ultrasound guidance:  I advanced a 25-gauge needle into the subacromial bursa and injected 1 cc Kenalog 40, 2 cc lidocaine, 2 cc bupivacaine. Completed without difficulty  Pain immediately resolved suggesting accurate placement of the medication.  Advised to call if fevers/chills, erythema, induration, drainage, or persistent bleeding.  Images permanently stored and available for review in the ultrasound unit.  Impression: Technically successful ultrasound guided injection.  Impression and Recommendations:    Rotator cuff syndrome, left Previous injection was 7 months ago, repeat today. She is under rehabbed. She needs to do the rehab exercises and. If insufficient relief we will proceed with formal PT.  Acute low back pain Recurrence of back pain, right-sided. This is likely right sacroiliac joint, certainly the right L5-S1 facet joint is another potential pain generator. She responded well to the SI joint rehab exercises so we are going to do this again. If insufficient improvement after 6 weeks we will do a diagnostic and therapeutic right sacroiliac joint injection.   ___________________________________________ Ihor Austin. Benjamin Stain, M.D., ABFM., CAQSM. Primary Care and Sports Medicine [facility]  Adjunct Professor of Family Medicine  [employer]

## 2018-11-09 NOTE — Assessment & Plan Note (Signed)
Recurrence of back pain, right-sided. This is likely right sacroiliac joint, certainly the right L5-S1 facet joint is another potential pain generator. She responded well to the SI joint rehab exercises so we are going to do this again. If insufficient improvement after 6 weeks we will do a diagnostic and therapeutic right sacroiliac joint injection.

## 2018-11-28 ENCOUNTER — Encounter: Payer: Self-pay | Admitting: Physician Assistant

## 2018-12-08 ENCOUNTER — Encounter (INDEPENDENT_AMBULATORY_CARE_PROVIDER_SITE_OTHER): Payer: BLUE CROSS/BLUE SHIELD | Admitting: Sports Medicine

## 2018-12-08 DIAGNOSIS — M545 Low back pain, unspecified: Secondary | ICD-10-CM

## 2018-12-09 NOTE — Telephone Encounter (Signed)
I spent 5 total minutes of online digital evaluation and management services. 

## 2018-12-10 ENCOUNTER — Ambulatory Visit (INDEPENDENT_AMBULATORY_CARE_PROVIDER_SITE_OTHER): Payer: BLUE CROSS/BLUE SHIELD | Admitting: Sports Medicine

## 2018-12-10 DIAGNOSIS — M545 Low back pain, unspecified: Secondary | ICD-10-CM

## 2018-12-10 NOTE — Assessment & Plan Note (Signed)
Persistent right-sided low back pain not responsive to rehabilitation exercises for over a month. Right SI joint injection today for diagnostic and therapeutic purposes. We will target the right L5-S1 facet joint if the SI joint injection does not provide sufficient relief. Return in 1 month.

## 2018-12-10 NOTE — Progress Notes (Signed)
Subjective:    CC: Acute low back pain  HPI: This is a pleasant 58 year old female, we have been treating her for acute low back pain for several months now.  He has done rehab exercises, NSAIDs, activity modification without much improvement.  Pain is localized in the right buttock, near the SI joint without radiation past the knee.  Worse with standing, twisting, bending.  I reviewed the past medical history, family history, social history, surgical history, and allergies today and no changes were needed.  Please see the problem list section below in epic for further details.  Past Medical History: Past Medical History:  Diagnosis Date  . Hyperlipidemia   . Hypertension   . Pacemaker syndrome    Past Surgical History: Past Surgical History:  Procedure Laterality Date  . BICEPS TENDON REPAIR  08/2007  . BREAST BIOPSY  12/1997  . CHOLECYSTECTOMY  05/2003  . TONSILLECTOMY AND ADENOIDECTOMY  1962   Social History: Social History   Socioeconomic History  . Marital status: Married    Spouse name: Not on file  . Number of children: Not on file  . Years of education: Not on file  . Highest education level: Not on file  Occupational History  . Not on file  Social Needs  . Financial resource strain: Not on file  . Food insecurity:    Worry: Not on file    Inability: Not on file  . Transportation needs:    Medical: Not on file    Non-medical: Not on file  Tobacco Use  . Smoking status: Former Smoker    Last attempt to quit: 03/26/2012    Years since quitting: 6.7  . Smokeless tobacco: Never Used  Substance and Sexual Activity  . Alcohol use: No  . Drug use: No  . Sexual activity: Yes    Partners: Male  Lifestyle  . Physical activity:    Days per week: Not on file    Minutes per session: Not on file  . Stress: Not on file  Relationships  . Social connections:    Talks on phone: Not on file    Gets together: Not on file    Attends religious service: Not on file   Active member of club or organization: Not on file    Attends meetings of clubs or organizations: Not on file    Relationship status: Not on file  Other Topics Concern  . Not on file  Social History Narrative  . Not on file   Family History: Family History  Problem Relation Age of Onset  . Alcoholism Father   . Bladder Cancer Father   . Heart attack Father   . Hypertension Father   . Lung cancer Mother   . Leukemia Paternal Grandmother    Allergies: Allergies  Allergen Reactions  . Vibramycin [Doxycycline Calcium] Hives   Medications: See med rec.  Review of Systems: No fevers, chills, night sweats, weight loss, chest pain, or shortness of breath.   Objective:    General: Well Developed, well nourished, and in no acute distress.  Neuro: Alert and oriented x3, extra-ocular muscles intact, sensation grossly intact.  HEENT: Normocephalic, atraumatic, pupils equal round reactive to light, neck supple, no masses, no lymphadenopathy, thyroid nonpalpable.  Skin: Warm and dry, no rashes. Cardiac: Regular rate and rhythm, no murmurs rubs or gallops, no lower extremity edema.  Respiratory: Clear to auscultation bilaterally. Not using accessory muscles, speaking in full sentences. Back Exam:  Inspection: Unremarkable  Motion: Flexion 45  deg, Extension 45 deg, Side Bending to 45 deg bilaterally,  Rotation to 45 deg bilaterally  SLR laying: Negative  XSLR laying: Negative  Palpable tenderness: Right sacroiliac joint. FABER: negative. Sensory change: Gross sensation intact to all lumbar and sacral dermatomes.  Reflexes: 2+ at both patellar tendons, 2+ at achilles tendons, Babinski's downgoing.  Strength at foot  Plantar-flexion: 5/5 Dorsi-flexion: 5/5 Eversion: 5/5 Inversion: 5/5  Leg strength  Quad: 5/5 Hamstring: 5/5 Hip flexor: 5/5 Hip abductors: 5/5  Gait unremarkable.  Procedure: Real-time Ultrasound Guided injection of the right sacroiliac joint Device: GE Logiq E  Verbal  informed consent obtained.  Time-out conducted.  Noted no overlying erythema, induration, or other signs of local infection.  Skin prepped in a sterile fashion.  Local anesthesia: Topical Ethyl chloride.  With sterile technique and under real time ultrasound guidance:  22-gauge spinal needle advanced into the right sacroiliac joint, I then injected 1 cc Kenalog 40, 2 cc lidocaine, 2 cc bupivacaine. Completed without difficulty  Pain immediately resolved suggesting accurate placement of the medication.  Advised to call if fevers/chills, erythema, induration, drainage, or persistent bleeding.  Images permanently stored and available for review in the ultrasound unit.  Impression: Technically successful ultrasound guided injection.  Impression and Recommendations:    Acute low back pain Persistent right-sided low back pain not responsive to rehabilitation exercises for over a month. Right SI joint injection today for diagnostic and therapeutic purposes. We will target the right L5-S1 facet joint if the SI joint injection does not provide sufficient relief. Return in 1 month.   ___________________________________________ Ihor Austinhomas J. Benjamin Stainhekkekandam, M.D., ABFM., CAQSM. Primary Care and Sports Medicine [facility]  Adjunct Professor of Family Medicine  [employer]

## 2018-12-24 ENCOUNTER — Encounter: Payer: Self-pay | Admitting: Sports Medicine

## 2018-12-24 DIAGNOSIS — M545 Low back pain, unspecified: Secondary | ICD-10-CM

## 2018-12-24 DIAGNOSIS — M47816 Spondylosis without myelopathy or radiculopathy, lumbar region: Secondary | ICD-10-CM

## 2019-02-08 ENCOUNTER — Telehealth: Payer: Self-pay | Admitting: *Deleted

## 2019-02-08 MED ORDER — TRAMADOL HCL 50 MG PO TABS: 50.0000 mg | ORAL_TABLET | Freq: Three times a day (TID) | ORAL | 0 refills | Status: DC | PRN

## 2019-02-08 NOTE — Telephone Encounter (Signed)
Switching to tramadol

## 2019-02-08 NOTE — Telephone Encounter (Signed)
Pt left vm wanting to know if you would call her something in for pain.  She is waiting to be scheduled with Dr. Isabelle Course office for her injections but her ins has not approved them yet.  She stated that she finished the meloxicam that you gave her but it didn't help any.  Please advise.

## 2019-02-09 NOTE — Telephone Encounter (Signed)
Pt notified of rx. 

## 2019-02-11 ENCOUNTER — Encounter: Payer: Self-pay | Admitting: Sports Medicine

## 2019-02-13 ENCOUNTER — Encounter: Payer: Self-pay | Admitting: Sports Medicine

## 2019-02-15 MED ORDER — TRAMADOL-ACETAMINOPHEN 37.5-325 MG PO TABS: 1.0000 | ORAL_TABLET | Freq: Three times a day (TID) | ORAL | 0 refills | Status: DC | PRN

## 2019-02-28 NOTE — Telephone Encounter (Signed)
Patient was notified to pick up x-ray discs with imaging in the same building. Rebecca Berry will take them to Dr. Ellouise Newer office. No further questions at this time.

## 2019-03-02 ENCOUNTER — Encounter: Payer: Self-pay | Admitting: Sports Medicine

## 2019-03-03 MED ORDER — HYDROCODONE-ACETAMINOPHEN 5-325 MG PO TABS: 1.0000 | ORAL_TABLET | Freq: Three times a day (TID) | ORAL | 0 refills | Status: DC | PRN

## 2019-03-11 ENCOUNTER — Encounter: Payer: Self-pay | Admitting: Sports Medicine

## 2019-03-13 IMAGING — DX DG CHEST 2V
2 series · 2 of 2 positions shown · non-contrast
Comparison: CT abdomen pelvis of 03/09/2014

CLINICAL DATA: Former smoking history, follow-up of aortic
atherosclerosis

EXAM:
CHEST  2 VIEW

[chest pa]
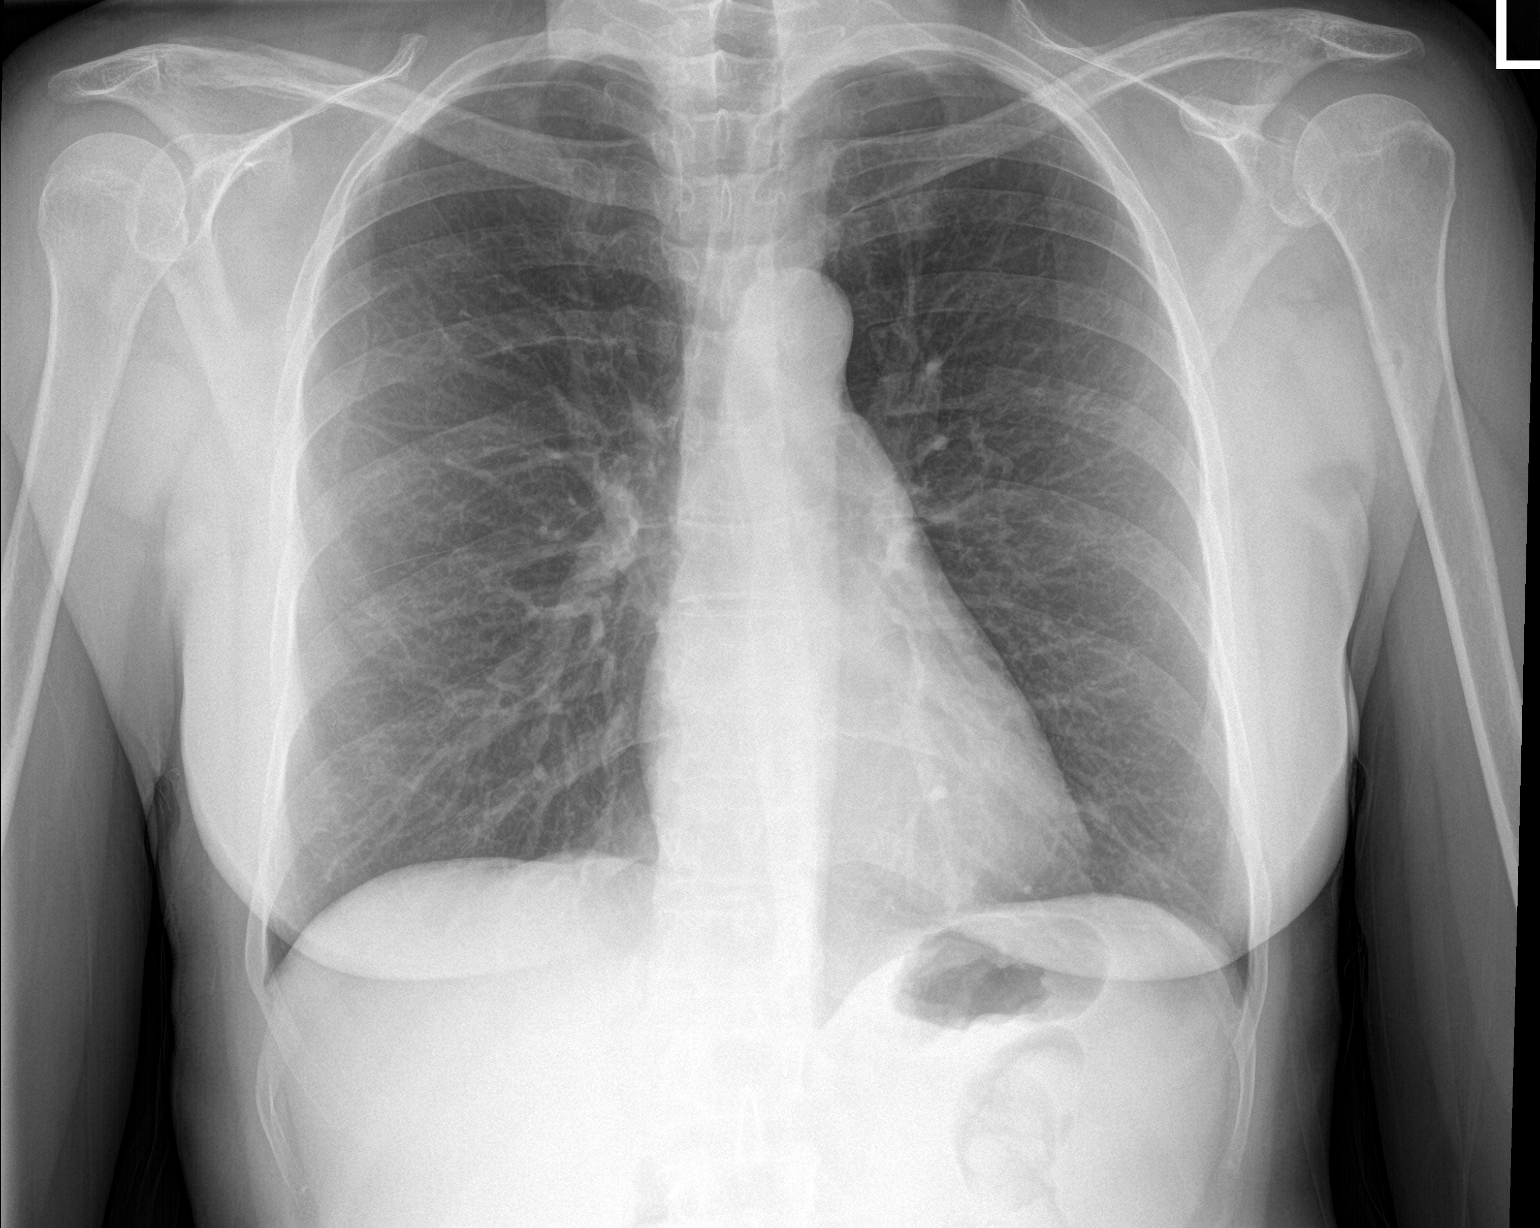

[chest lat]
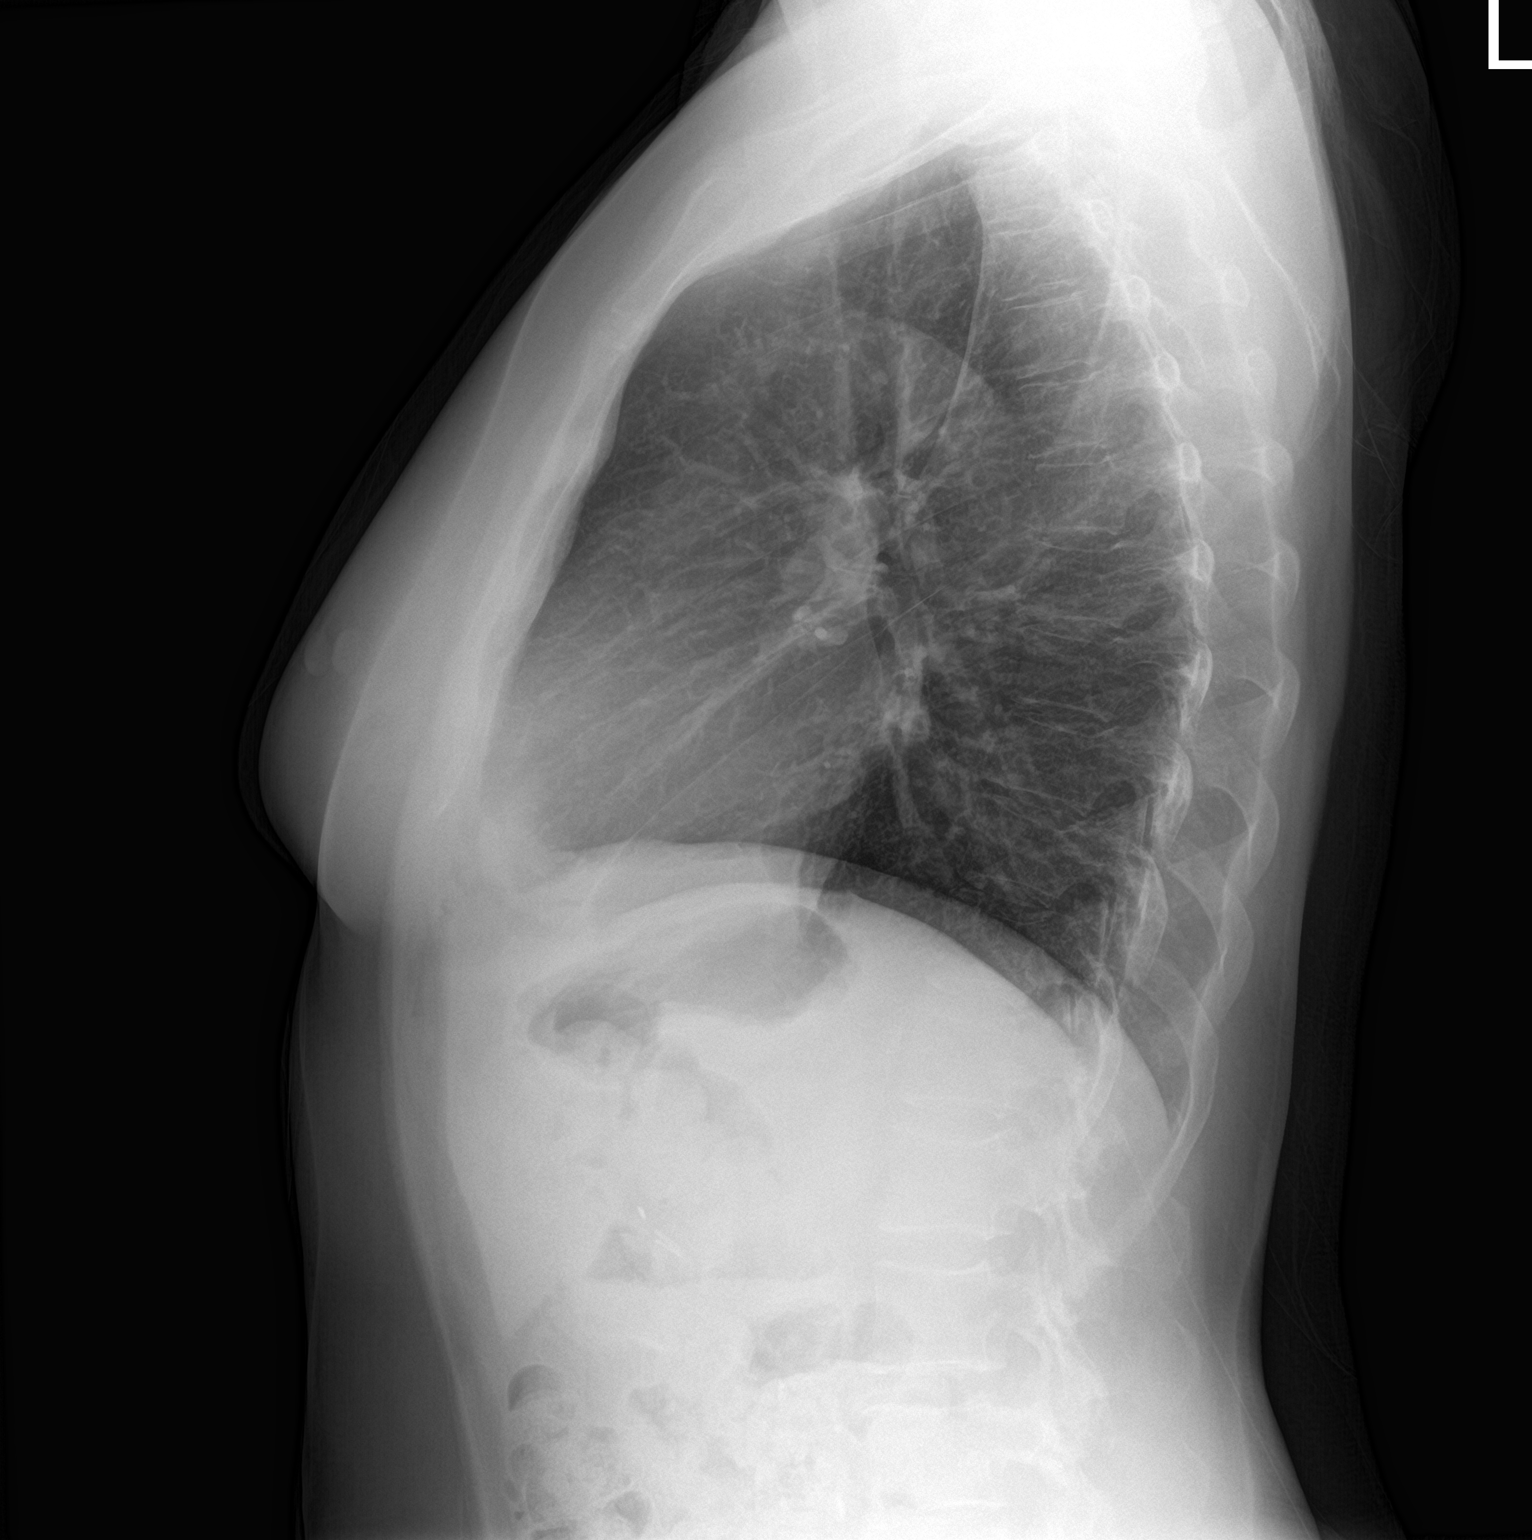

[2 of 2 positions shown; findings below may reference images not displayed]

FINDINGS: No prior chest x-rays are available for comparison. The lungs are
clear and well aerated. Mediastinal and hilar contours are
unremarkable. The CT of the abdomen pelvis from 6115 does mention
severe atherosclerotic change of the abdominal aorta and iliac
vessels but no chest x-ray is available for comparison. No
significant calcification of the thoracic aorta is seen by plain
film. The heart is within normal limits in size. No bony abnormality
is seen.
IMPRESSION: No active cardiopulmonary disease.

## 2019-03-21 ENCOUNTER — Encounter: Payer: Self-pay | Admitting: Sports Medicine

## 2019-03-29 ENCOUNTER — Encounter: Payer: Self-pay | Admitting: Physician Assistant

## 2019-03-29 DIAGNOSIS — R067 Sneezing: Secondary | ICD-10-CM

## 2019-03-29 DIAGNOSIS — J302 Other seasonal allergic rhinitis: Secondary | ICD-10-CM

## 2019-03-31 ENCOUNTER — Encounter: Payer: Self-pay | Admitting: Allergy

## 2019-03-31 ENCOUNTER — Other Ambulatory Visit: Payer: Self-pay

## 2019-03-31 ENCOUNTER — Ambulatory Visit (INDEPENDENT_AMBULATORY_CARE_PROVIDER_SITE_OTHER): Payer: BC Managed Care – PPO | Admitting: Allergy

## 2019-03-31 VITALS — BP 132/78 | HR 90 | Temp 97.4°F | Resp 18 | Ht 63.2 in | Wt 133.8 lb

## 2019-03-31 DIAGNOSIS — J3089 Other allergic rhinitis: Secondary | ICD-10-CM

## 2019-03-31 NOTE — Progress Notes (Signed)
New Patient Note  RE: Rebecca Berry MRN: [id number] DOB: 1961/07/26 Date of Office Visit: 03/31/2019  Referring provider: Lavada Mesi Primary care provider: Donella Stade, PA-C  Chief Complaint: allergies  History of present illness: Rebecca Berry is a 58 y.o. female presenting today for consultation for allergies.   She states she did have allergy testing over 20 years ago from a ENT doctor.  She recalls being allergic to dust mites and candida albicans.  She did undergo allergen immunotherapy that she states she would get shots twice a week for about 5 years.    This year she states her allergy symptoms have returned where she is constantly sneezing.  Her husband she states started having asthma attacks related to dog exposure which was new for him thus she is wondering if she also has new allergies now.    She states she does not take anything for the sneezing.  She states she does not like to take medications if she can help it.  She does take benadryl at night due to husband snoring.    She also reports having history of migraines and is on a migraine medication over the past year or so.  She also is wondering if potentially her allergies are contributing to her migraines at this time.  She lives in [place] and currently is living in a RV and is supposed to be traveling around the country however with Covid those plans were halted.  She states that she is always outside as they love to hike and their other motor transportation is a motorcycle.  She states once the concern surrounding coronavirus dies down some that her and her husband will be on the road traveling.  She denies a history of asthma, eczema or food allergy.  Review of systems: Review of Systems  Constitutional: Negative for chills, fever and malaise/fatigue.  HENT: Negative for congestion, ear discharge, nosebleeds and sore throat.   Eyes: Negative for pain, discharge and redness.   Respiratory: Negative for cough, shortness of breath and wheezing.   Cardiovascular: Negative for chest pain.  Gastrointestinal: Negative for abdominal pain, constipation, diarrhea, heartburn, nausea and vomiting.  Musculoskeletal: Negative for joint pain.  Skin: Negative for itching and rash.  Neurological: Negative for headaches.    All other systems negative unless noted above in HPI  Past medical history: Past Medical History:  Diagnosis Date  . Back pain   . Hyperlipidemia   . Hypertension   . Migraines   . Pacemaker syndrome     Past surgical history: Past Surgical History:  Procedure Laterality Date  . ADENOIDECTOMY    . BICEPS TENDON REPAIR  08/2007  . BREAST BIOPSY  12/1997  . CHOLECYSTECTOMY  05/2003  . TONSILLECTOMY AND ADENOIDECTOMY  1962    Family history:  Family History  Problem Relation Age of Onset  . Alcoholism Father   . Bladder Cancer Father   . Heart attack Father   . Hypertension Father   . Lung cancer Mother   . Leukemia Paternal Grandmother   . Asthma Brother   . Eczema Other   . Allergic rhinitis Neg Hx   . Urticaria Neg Hx     Social history: Her home has carpeting with electric heating and central cooling.  As above currently living in an RV.  She has no pets.  There is no concern for water damage, mildew or roaches.  She is retired.   Tobacco Use  . Smoking  status: Former Smoker    Quit date: 03/26/2012    Years since quitting: 7.0  . Smokeless tobacco: Never Used    Medication List: Allergies as of 03/31/2019      Reactions   Vibramycin [doxycycline Calcium] Hives      Medication List       Accurate as of March 31, 2019 10:35 AM. If you have any questions, ask your nurse or doctor.        STOP taking these medications   HYDROcodone-acetaminophen 5-325 MG tablet Commonly known as: NORCO/VICODIN Stopped by: Shaylar Larose HiresPatricia Padgett, MD   traMADol 50 MG tablet Commonly known as: ULTRAM Stopped by: Shaylar Larose HiresPatricia  Padgett, MD   traMADol-acetaminophen 37.5-325 MG tablet Commonly known as: ULTRACET Stopped by: Shaylar Larose HiresPatricia Padgett, MD     TAKE these medications   atorvastatin 40 MG tablet Commonly known as: LIPITOR Take 1 tablet (40 mg total) by mouth daily.   benazepril 10 MG tablet Commonly known as: LOTENSIN Take 1 tablet (10 mg total) by mouth daily.   diazepam 10 MG tablet Commonly known as: VALIUM Take 0.5-1 tablets (5-10 mg total) by mouth every 12 (twelve) hours as needed for anxiety.   SUMAtriptan 50 MG tablet Commonly known as: IMITREX TAKE ONE TABLET BY MOUTH EVERY 2 HOURS AS NEEDED FOR MIGRAINE OR HEADACHE. MAY REPEAT IN 2 HOURS IF HEADACHE PERSISTS OR RECURS.   Topiramate ER 100 MG Cp24 Commonly known as: Trokendi XR Take 1 capsule by mouth at bedtime.       Known medication allergies: Allergies  Allergen Reactions  . Vibramycin [Doxycycline Calcium] Hives     Physical examination: Blood pressure 132/78, pulse 90, temperature (!) 97.4 F (36.3 C), temperature source Temporal, resp. rate 18, height 5' 3.2" (1.605 m), weight 133 lb 12.8 oz (60.7 kg), SpO2 97 %.  General: Alert, interactive, in no acute distress. HEENT: PERRLA, TMs pearly gray, turbinates non-edematous without discharge, post-pharynx non erythematous. Neck: Supple without lymphadenopathy. Lungs: Clear to auscultation without wheezing, rhonchi or rales. {no increased work of breathing. CV: Normal S1, S2 without murmurs. Abdomen: Nondistended, nontender. Skin: Warm and dry, without lesions or rashes. Extremities:  No clubbing, cyanosis or edema. Neuro:   Grossly intact.  Diagnositics/Labs:  Allergy testing: environmental allergy skin prick testing is negative with positive histamine control.  Intradermal testing is mildly reactive to cat. Allergy testing results were read and interpreted by provider, documented by clinical staff.   Assessment and plan:   Allergic rhinitis  - environmental  allergy skin testing today is slightly reactive to cat  - allergen avoidance measures discussed/handouts provided  - recommend using nasal saline spray daily to help clean/flush the nose of any pollens/particles you have inhaled during the day  - you can try an antihistamine like Xyzal 5mg  to see if this helps decrease sneezing attacks   Follow-up as needed  I appreciate the opportunity to take part in Jalah's care. Please do not hesitate to contact me with questions.  Sincerely,   Margo AyeShaylar Padgett, MD Allergy/Immunology [facility]

## 2019-03-31 NOTE — Patient Instructions (Addendum)
Allergies  - environmental allergy skin testing today is slightly reactive to cat  - allergen avoidance measures discussed/handouts provided  - recommend using nasal saline spray daily to help clean/flush the nose of any pollens/particles you have inhaled during the day  - you can try an antihistamine like Xyzal 5mg  to see if this helps decrease sneezing attacks    Follow-up as needed

## 2019-04-08 ENCOUNTER — Ambulatory Visit (INDEPENDENT_AMBULATORY_CARE_PROVIDER_SITE_OTHER): Payer: BC Managed Care – PPO | Admitting: Sports Medicine

## 2019-04-08 ENCOUNTER — Encounter: Payer: Self-pay | Admitting: Sports Medicine

## 2019-04-08 ENCOUNTER — Ambulatory Visit (INDEPENDENT_AMBULATORY_CARE_PROVIDER_SITE_OTHER): Payer: BC Managed Care – PPO

## 2019-04-08 ENCOUNTER — Other Ambulatory Visit: Payer: Self-pay

## 2019-04-08 DIAGNOSIS — M75102 Unspecified rotator cuff tear or rupture of left shoulder, not specified as traumatic: Secondary | ICD-10-CM

## 2019-04-08 NOTE — Assessment & Plan Note (Addendum)
Persistent impingement symptoms, continue rehabilitation exercises, at this point we are going to proceed with MRI for interventional planning. She has had greater than 6 weeks of physician directed conservative measures. X-rays today as well. Return to go over MRI results.

## 2019-04-08 NOTE — Progress Notes (Signed)
Subjective:    CC: Persistent left shoulder pain  HPI: Rebecca Berry is a pleasant 58 year old female, we have treated her for shoulder pain in the past, it is been on and off for over a year, unfortunately she is having persistence of pain localized over the deltoid, worse with overhead activities, she did recently paint a ceiling.  She has had greater than 6 weeks of physician directed conservative measures.  I reviewed the past medical history, family history, social history, surgical history, and allergies today and no changes were needed.  Please see the problem list section below in epic for further details.  Past Medical History: Past Medical History:  Diagnosis Date  . Back pain   . Hyperlipidemia   . Hypertension   . Migraines   . Pacemaker syndrome    Past Surgical History: Past Surgical History:  Procedure Laterality Date  . ADENOIDECTOMY    . BICEPS TENDON REPAIR  08/2007  . BREAST BIOPSY  12/1997  . CHOLECYSTECTOMY  05/2003  . TONSILLECTOMY AND ADENOIDECTOMY  1962   Social History: Social History   Socioeconomic History  . Marital status: Married    Spouse name: Not on file  . Number of children: Not on file  . Years of education: Not on file  . Highest education level: Not on file  Occupational History  . Not on file  Social Needs  . Financial resource strain: Not on file  . Food insecurity    Worry: Not on file    Inability: Not on file  . Transportation needs    Medical: Not on file    Non-medical: Not on file  Tobacco Use  . Smoking status: Former Smoker    Quit date: 03/26/2012    Years since quitting: 7.0  . Smokeless tobacco: Never Used  Substance and Sexual Activity  . Alcohol use: No  . Drug use: No  . Sexual activity: Yes    Partners: Male  Lifestyle  . Physical activity    Days per week: Not on file    Minutes per session: Not on file  . Stress: Not on file  Relationships  . Social Herbalist on phone: Not on file    Gets  together: Not on file    Attends religious service: Not on file    Active member of club or organization: Not on file    Attends meetings of clubs or organizations: Not on file    Relationship status: Not on file  Other Topics Concern  . Not on file  Social History Narrative  . Not on file   Family History: Family History  Problem Relation Age of Onset  . Alcoholism Father   . Bladder Cancer Father   . Heart attack Father   . Hypertension Father   . Lung cancer Mother   . Leukemia Paternal Grandmother   . Asthma Brother   . Eczema Other   . Allergic rhinitis Neg Hx   . Urticaria Neg Hx    Allergies: Allergies  Allergen Reactions  . Vibramycin [Doxycycline Calcium] Hives   Medications: See med rec.  Review of Systems: No fevers, chills, night sweats, weight loss, chest pain, or shortness of breath.   Objective:    General: Well Developed, well nourished, and in no acute distress.  Neuro: Alert and oriented x3, extra-ocular muscles intact, sensation grossly intact.  HEENT: Normocephalic, atraumatic, pupils equal round reactive to light, neck supple, no masses, no lymphadenopathy, thyroid nonpalpable.  Skin:  Warm and dry, no rashes. Cardiac: Regular rate and rhythm, no murmurs rubs or gallops, no lower extremity edema.  Respiratory: Clear to auscultation bilaterally. Not using accessory muscles, speaking in full sentences. Left shoulder: Inspection reveals no abnormalities, atrophy or asymmetry. Palpation is normal with no tenderness over AC joint or bicipital groove. ROM is full in all planes. Rotator cuff strength normal throughout. Positive Neer and Hawkin's tests, empty can. Speeds and Yergason's tests normal. No labral pathology noted with negative Obrien's, negative crank, negative clunk, and good stability. Normal scapular function observed. No painful arc and no drop arm sign. No apprehension sign  Impression and Recommendations:    Rotator cuff syndrome,  left Persistent impingement symptoms, continue rehabilitation exercises, at this point we are going to proceed with MRI for interventional planning. She has had greater than 6 weeks of physician directed conservative measures. X-rays today as well. Return to go over MRI results.   ___________________________________________ Ihor Austinhomas J. Benjamin Stainhekkekandam, M.D., ABFM., CAQSM. Primary Care and Sports Medicine [facility]  Adjunct Professor of Family Medicine  [employer]

## 2019-04-17 ENCOUNTER — Ambulatory Visit (INDEPENDENT_AMBULATORY_CARE_PROVIDER_SITE_OTHER): Payer: BC Managed Care – PPO

## 2019-04-17 ENCOUNTER — Other Ambulatory Visit: Payer: Self-pay

## 2019-04-17 DIAGNOSIS — M75102 Unspecified rotator cuff tear or rupture of left shoulder, not specified as traumatic: Secondary | ICD-10-CM

## 2019-04-18 ENCOUNTER — Ambulatory Visit (INDEPENDENT_AMBULATORY_CARE_PROVIDER_SITE_OTHER): Payer: BC Managed Care – PPO | Admitting: Sports Medicine

## 2019-04-18 ENCOUNTER — Encounter: Payer: Self-pay | Admitting: Sports Medicine

## 2019-04-18 DIAGNOSIS — M75102 Unspecified rotator cuff tear or rupture of left shoulder, not specified as traumatic: Secondary | ICD-10-CM

## 2019-04-18 NOTE — Progress Notes (Signed)
Subjective:    CC: Left shoulder pain  HPI: This is a pleasant 58 year old female, she returns to go over her MRI results, she is had chronic and intermittent left shoulder pain, this responded well to injections in the past.  MRI results will be dictated below, pain is moderate, persistent, localized over the deltoid and worse with overhead activities.  I reviewed the past medical history, family history, social history, surgical history, and allergies today and no changes were needed.  Please see the problem list section below in epic for further details.  Past Medical History: Past Medical History:  Diagnosis Date  . Back pain   . Hyperlipidemia   . Hypertension   . Migraines   . Pacemaker syndrome    Past Surgical History: Past Surgical History:  Procedure Laterality Date  . ADENOIDECTOMY    . BICEPS TENDON REPAIR  08/2007  . BREAST BIOPSY  12/1997  . CHOLECYSTECTOMY  05/2003  . TONSILLECTOMY AND ADENOIDECTOMY  1962   Social History: Social History   Socioeconomic History  . Marital status: Married    Spouse name: Not on file  . Number of children: Not on file  . Years of education: Not on file  . Highest education level: Not on file  Occupational History  . Not on file  Social Needs  . Financial resource strain: Not on file  . Food insecurity    Worry: Not on file    Inability: Not on file  . Transportation needs    Medical: Not on file    Non-medical: Not on file  Tobacco Use  . Smoking status: Former Smoker    Quit date: 03/26/2012    Years since quitting: 7.0  . Smokeless tobacco: Never Used  Substance and Sexual Activity  . Alcohol use: No  . Drug use: No  . Sexual activity: Yes    Partners: Male  Lifestyle  . Physical activity    Days per week: Not on file    Minutes per session: Not on file  . Stress: Not on file  Relationships  . Social Musicianconnections    Talks on phone: Not on file    Gets together: Not on file    Attends religious service: Not  on file    Active member of club or organization: Not on file    Attends meetings of clubs or organizations: Not on file    Relationship status: Not on file  Other Topics Concern  . Not on file  Social History Narrative  . Not on file   Family History: Family History  Problem Relation Age of Onset  . Alcoholism Father   . Bladder Cancer Father   . Heart attack Father   . Hypertension Father   . Lung cancer Mother   . Leukemia Paternal Grandmother   . Asthma Brother   . Eczema Other   . Allergic rhinitis Neg Hx   . Urticaria Neg Hx    Allergies: Allergies  Allergen Reactions  . Vibramycin [Doxycycline Calcium] Hives   Medications: See med rec.  Review of Systems: No fevers, chills, night sweats, weight loss, chest pain, or shortness of breath.   Objective:    General: Well Developed, well nourished, and in no acute distress.  Neuro: Alert and oriented x3, extra-ocular muscles intact, sensation grossly intact.  HEENT: Normocephalic, atraumatic, pupils equal round reactive to light, neck supple, no masses, no lymphadenopathy, thyroid nonpalpable.  Skin: Warm and dry, no rashes. Cardiac: Regular rate and rhythm,  no murmurs rubs or gallops, no lower extremity edema.  Respiratory: Clear to auscultation bilaterally. Not using accessory muscles, speaking in full sentences.  MRI personally reviewed, there is rotator cuff tendinopathy, as well as a partial articular sided tear of the supraspinatus.  Procedure: Real-time Ultrasound Guided injection of the left subacromial bursa Device: GE Logiq E  Verbal informed consent obtained.  Time-out conducted.  Noted no overlying erythema, induration, or other signs of local infection.  Skin prepped in a sterile fashion.  Local anesthesia: Topical Ethyl chloride.  With sterile technique and under real time ultrasound guidance:  1 cc Kenalog 40, 1 cc lidocaine, 1 cc bupivacaine injected easily Completed without difficulty  Pain  immediately resolved suggesting accurate placement of the medication.  Advised to call if fevers/chills, erythema, induration, drainage, or persistent bleeding.  Images permanently stored and available for review in the ultrasound unit.  Impression: Technically successful ultrasound guided injection.  Procedure: Real-time Ultrasound Guided injection of the left glenohumeral joint Device: GE Logiq E  Verbal informed consent obtained.  Time-out conducted.  Noted no overlying erythema, induration, or other signs of local infection.  Skin prepped in a sterile fashion.  Local anesthesia: Topical Ethyl chloride.  With sterile technique and under real time ultrasound guidance:  1 cc Kenalog 40, 2 cc lidocaine, 2 cc bupivacaine injected easily Completed without difficulty  Pain immediately resolved suggesting accurate placement of the medication.  Advised to call if fevers/chills, erythema, induration, drainage, or persistent bleeding.  Images permanently stored and available for review in the ultrasound unit.  Impression: Technically successful ultrasound guided injection.  Impression and Recommendations:    Rotator cuff syndrome, left Rotator cuff tendinosis and articular sided tearing of the supraspinatus. Subacromial and glenohumeral injections performed today under ultrasound guidance. Continue rehabilitation exercises, we can do a virtual visit in 6 weeks.   ___________________________________________ Gwen Her. Dianah Field, M.D., ABFM., CAQSM. Primary Care and Sports Medicine [facility]  Adjunct Professor of [employer] [facility] [employer]

## 2019-04-18 NOTE — Assessment & Plan Note (Signed)
Rotator cuff tendinosis and articular sided tearing of the supraspinatus. Subacromial and glenohumeral injections performed today under ultrasound guidance. Continue rehabilitation exercises, we can do a virtual visit in 6 weeks.

## 2019-04-22 ENCOUNTER — Encounter: Payer: Self-pay | Admitting: Sports Medicine

## 2019-05-20 ENCOUNTER — Encounter: Payer: Self-pay | Admitting: Physician Assistant

## 2019-05-24 MED ORDER — HYDROCORTISONE 2.5 % EX CREA: TOPICAL_CREAM | Freq: Two times a day (BID) | CUTANEOUS | 0 refills | Status: DC

## 2019-05-30 ENCOUNTER — Ambulatory Visit: Payer: Managed Care, Other (non HMO) | Admitting: Sports Medicine

## 2019-05-30 ENCOUNTER — Encounter: Payer: Self-pay | Admitting: Sports Medicine

## 2019-08-28 ENCOUNTER — Encounter: Payer: Self-pay | Admitting: Physician Assistant

## 2019-08-29 ENCOUNTER — Encounter: Payer: Self-pay | Admitting: Physician Assistant

## 2019-08-29 NOTE — Telephone Encounter (Signed)
Can we schedule virtually and make 40 minutes.

## 2019-08-31 NOTE — Telephone Encounter (Signed)
Information has been sent to insurance and I am waiting on a response.  

## 2019-09-01 NOTE — Telephone Encounter (Signed)
Pending generic topamax for patient to start since trokendi is trying to get approved. She can start 1 tablet at bedtime and 7 days later increase to 1 table in am. I pended rx because she is out of town and I an uncertain where to send. Can we call her?

## 2019-09-01 NOTE — Telephone Encounter (Signed)
Tried to call patient with no answer and vm full. Sent [employer].

## 2019-09-02 ENCOUNTER — Telehealth (INDEPENDENT_AMBULATORY_CARE_PROVIDER_SITE_OTHER): Payer: BC Managed Care – PPO | Admitting: Physician Assistant

## 2019-09-02 VITALS — BP 166/80 | HR 70 | Temp 96.8°F | Ht 63.0 in | Wt 132.0 lb

## 2019-09-02 DIAGNOSIS — G43009 Migraine without aura, not intractable, without status migrainosus: Secondary | ICD-10-CM | POA: Diagnosis not present

## 2019-09-02 DIAGNOSIS — R238 Other skin changes: Secondary | ICD-10-CM | POA: Diagnosis not present

## 2019-09-02 DIAGNOSIS — R233 Spontaneous ecchymoses: Secondary | ICD-10-CM

## 2019-09-02 DIAGNOSIS — I10 Essential (primary) hypertension: Secondary | ICD-10-CM | POA: Diagnosis not present

## 2019-09-02 DIAGNOSIS — I493 Ventricular premature depolarization: Secondary | ICD-10-CM | POA: Diagnosis not present

## 2019-09-02 MED ORDER — TOPIRAMATE 50 MG PO TABS: 50.0000 mg | ORAL_TABLET | Freq: Two times a day (BID) | ORAL | 1 refills | Status: DC

## 2019-09-02 MED ORDER — EMGALITY 120 MG/ML ~~LOC~~ SOAJ: 120.0000 mg | SUBCUTANEOUS | 1 refills | Status: DC

## 2019-09-02 MED ORDER — EMGALITY 120 MG/ML ~~LOC~~ SOAJ: 240.0000 mg | SUBCUTANEOUS | 0 refills | Status: DC

## 2019-09-02 NOTE — Progress Notes (Signed)
Bruising - added vitamin k  Migraines worsening over last 4 months Has been taking excedrin - migraine and sumatriptan Was taking Trokendi but has new insurance and it needs new authorization, took it last Friday Topamax sent this morning

## 2019-09-02 NOTE — Progress Notes (Signed)
Patient ID: Rebecca Berry, female   DOB: 06-Aug-1961, 59 y.o.   MRN: [id number] .Marland KitchenVirtual Visit via Telephone Note  I connected with Larita Fife on 09/02/2019 at  1:00 PM EST by telephone and verified that I am speaking with the correct person using two identifiers.  Location: Patient: home Provider: clinic   I discussed the limitations, risks, security and privacy concerns of performing an evaluation and management service by telephone and the availability of in person appointments. I also discussed with the patient that there may be a patient responsible charge related to this service. The patient expressed understanding and agreed to proceed.   History of Present Illness: Pt is a 59 yo female with HTN, Migraines, PVC, HLD who calls into the clinic to discuss worsening migraines.   Patient has been noticing more more migraine days a month.  For the last month she has had a migraine at least 5 out of the 7 days.  Imitrex works well for rescue however insurance will only give her 32-month which is not enough to abort all her migraines.  She also uses excedrin but does not help as much. She is not regularly checking her blood pressure.  She denies any chest pain, palpitations, vision changes. She denies any auras.  Trokendi was working some but she was still having quite a few migraines.  She has been off of it for a week and her migraines have worsened aggressively.  Trokendi is at the pharmacy waiting to be picked up.  She has noticed a lot of bruising. She added vit K supplement.  .. Active Ambulatory Problems    Diagnosis Date Noted  . Preventive measure 11/15/2013  . Essential hypertension, benign 11/15/2013  . Hyperlipidemia 11/15/2013  . Encounter for monitoring statin therapy 11/15/2013  . Wandering pacemaker 11/15/2013  . Migraine 11/15/2013  . Unintentional weight change 11/15/2013  . Hearing loss 11/15/2013  . Fatigue 11/16/2013  . Plantar fasciitis, left 02/27/2014  .  Hiatal hernia 03/10/2014  . Hepatic steatosis 03/10/2014  . Atherosclerosis of aorta (HCC) 03/12/2014  . Insomnia 05/17/2015  . Situational anxiety 03/18/2016  . History of cellulitis 12/17/2016  . PVC (premature ventricular contraction) 01/12/2017  . Hot flashes 05/21/2017  . Rotator cuff syndrome, left 11/26/2017  . Lytic lesion of bone on x-ray 11/26/2017  . Lumbar spondylosis 08/24/2018   Resolved Ambulatory Problems    Diagnosis Date Noted  . No Resolved Ambulatory Problems   Past Medical History:  Diagnosis Date  . Back pain   . Hypertension   . Migraines   . Pacemaker syndrome    Reviewed med, allergies, problem list.    Observations/Objective: No acute distress. Normal mood and appearance.  Normal breathing.   .. Today's Vitals   09/02/19 1130  BP: (!) 166/80  Pulse: 70  Temp: (!) 96.8 F (36 C)  TempSrc: Oral  Weight: 132 lb (59.9 kg)  Height: 5\' 3"  (1.6 m)   Body mass index is 23.38 kg/m.    Assessment and Plan: Marland KitchenMeshelle was seen today for migraine.  Diagnoses and all orders for this visit:  Migraine without aura and without status migrainosus, not intractable -     Galcanezumab-gnlm (EMGALITY) 120 MG/ML SOAJ; Inject 240 mg into the skin every 30 (thirty) days. -     Galcanezumab-gnlm (EMGALITY) 120 MG/ML SOAJ; Inject 120 mg into the skin every 30 (thirty) days.  Essential hypertension, benign  PVC (premature ventricular contraction)  Easy bruising   BP not controlled. Keep log  for the next 2 weeks and report readings back.  Uncontrolled BP could also be causing worsening migraines.   Keep migraine diary and continue to look for trigger.  Stay on trokendi. Added emgality.  Discussed how to use and side effects.  Follow up in 3 months.   Discussed how vitamin K clots blood some.  Stay at low dose.  You don't have any risk factors for over clotted blood. Stay at low dose. Need to get labs soon to make sure blood is not too thick.      Follow Up Instructions:    I discussed the assessment and treatment plan with the patient. The patient was provided an opportunity to ask questions and all were answered. The patient agreed with the plan and demonstrated an understanding of the instructions.   The patient was advised to call back or seek an in-person evaluation if the symptoms worsen or if the condition fails to improve as anticipated.     Iran Planas, PA-C

## 2019-09-02 NOTE — Telephone Encounter (Signed)
Received a fax from [employer] that a PA was approved for Trokendi from 08/31/2019 through 08/29/2022. Pharmacy aware and form sent to scan. Mychart message sent to patient.

## 2019-09-02 NOTE — Telephone Encounter (Signed)
Prescription sent

## 2019-09-05 ENCOUNTER — Encounter: Payer: Self-pay | Admitting: Physician Assistant

## 2019-09-05 NOTE — Telephone Encounter (Signed)
PA for emgality

## 2019-09-07 NOTE — Telephone Encounter (Signed)
Information sent to insurance for [employer].

## 2019-09-09 NOTE — Telephone Encounter (Signed)
Approved today ([employer]) Effective from 09/07/2019 through 12/05/2019. Pharmacy aware.

## 2019-09-15 ENCOUNTER — Encounter: Payer: Self-pay | Admitting: Physician Assistant

## 2019-09-23 ENCOUNTER — Encounter: Payer: Self-pay | Admitting: Physician Assistant

## 2019-09-23 NOTE — Telephone Encounter (Signed)
Patient called back and left vm that she was out of town and having terrible reception for virtual visit. Christon Gallaway - please advise.

## 2019-09-27 ENCOUNTER — Encounter: Payer: Self-pay | Admitting: Physician Assistant

## 2019-09-27 ENCOUNTER — Telehealth (INDEPENDENT_AMBULATORY_CARE_PROVIDER_SITE_OTHER): Payer: BC Managed Care – PPO | Admitting: Physician Assistant

## 2019-09-27 VITALS — BP 166/96 | Temp 97.4°F | Ht 63.0 in | Wt 132.0 lb

## 2019-09-27 DIAGNOSIS — J329 Chronic sinusitis, unspecified: Secondary | ICD-10-CM

## 2019-09-27 DIAGNOSIS — I10 Essential (primary) hypertension: Secondary | ICD-10-CM

## 2019-09-27 DIAGNOSIS — J40 Bronchitis, not specified as acute or chronic: Secondary | ICD-10-CM | POA: Diagnosis not present

## 2019-09-27 MED ORDER — HYDROCOD POLST-CPM POLST ER 10-8 MG/5ML PO SUER: 5.0000 mL | Freq: Two times a day (BID) | ORAL | 0 refills | Status: DC | PRN

## 2019-09-27 MED ORDER — AZITHROMYCIN 250 MG PO TABS: ORAL_TABLET | ORAL | 0 refills | Status: DC

## 2019-09-27 MED ORDER — BENAZEPRIL HCL 20 MG PO TABS: 20.0000 mg | ORAL_TABLET | Freq: Every day | ORAL | 0 refills | Status: DC

## 2019-09-27 MED ORDER — PREDNISONE 50 MG PO TABS: ORAL_TABLET | ORAL | 0 refills | Status: DC

## 2019-09-27 NOTE — Telephone Encounter (Signed)
Appt today

## 2019-09-27 NOTE — Progress Notes (Deleted)
  Tuesday - started with runny nose Wednesday - cough/ congestion Chest pain  Has been taking Mucinex/ Dayquil/Nyquil/Robitussin since Friday pm

## 2019-09-28 ENCOUNTER — Encounter: Payer: Self-pay | Admitting: Physician Assistant

## 2019-09-28 MED ORDER — AZITHROMYCIN 250 MG PO TABS: ORAL_TABLET | ORAL | 0 refills | Status: DC

## 2019-09-28 MED ORDER — BENAZEPRIL HCL 20 MG PO TABS: 20.0000 mg | ORAL_TABLET | Freq: Every day | ORAL | 0 refills | Status: DC

## 2019-09-28 MED ORDER — PREDNISONE 50 MG PO TABS: ORAL_TABLET | ORAL | 0 refills | Status: DC

## 2019-09-28 MED ORDER — HYDROCOD POLST-CPM POLST ER 10-8 MG/5ML PO SUER: 5.0000 mL | Freq: Two times a day (BID) | ORAL | 0 refills | Status: DC | PRN

## 2019-09-28 NOTE — Progress Notes (Signed)
Patient ID: Rebecca Berry, female   DOB: [birth year], 59 y.o.   MRN: [id number] .Marland KitchenVirtual Visit via Video Note  I connected with Rebecca Berry on 09/27/2019 at  4:00 PM EST by a video enabled telemedicine application and verified that I am speaking with the correct person using two identifiers.  Location: Patient: home Provider: clinic   I discussed the limitations of evaluation and management by telemedicine and the availability of in person appointments. The patient expressed understanding and agreed to proceed.  History of Present Illness: Pt is a 59 yo female with HTN who calls into the clinic from her daughters house [place]. She has been sick for 1 week now. Started with runny nose and went into chest/sinus pressure/congestion. She is blowing and coughing up green/yellow sputum. No significant SOB. Cough is constant and keeps her awake at night. She denies any fever. Taking mucinex, dayquil, nyquil, ibuprofen with no relief. No loss of smell or taste, GI symptoms, fatigue, body aches. No other sick contacts.   .. Active Ambulatory Problems    Diagnosis Date Noted  . Preventive measure 11/15/2013  . Essential hypertension, benign 11/15/2013  . Hyperlipidemia 11/15/2013  . Encounter for monitoring statin therapy 11/15/2013  . Wandering pacemaker 11/15/2013  . Migraine 11/15/2013  . Unintentional weight change 11/15/2013  . Hearing loss 11/15/2013  . Fatigue 11/16/2013  . Plantar fasciitis, left 02/27/2014  . Hiatal hernia 03/10/2014  . Hepatic steatosis 03/10/2014  . Atherosclerosis of aorta (HCC) 03/12/2014  . Insomnia 05/17/2015  . Situational anxiety 03/18/2016  . History of cellulitis 12/17/2016  . PVC (premature ventricular contraction) 01/12/2017  . Hot flashes 05/21/2017  . Rotator cuff syndrome, left 11/26/2017  . Lytic lesion of bone on x-ray 11/26/2017  . Lumbar spondylosis 08/24/2018   Resolved Ambulatory Problems    Diagnosis Date Noted  . No Resolved  Ambulatory Problems   Past Medical History:  Diagnosis Date  . Back pain   . Hypertension   . Migraines   . Pacemaker syndrome    Reviewed med, allergy, problem list.     Observations/Objective: No acute distress.  Wet productive cough on exam.  Normal mood and appearance.   .. Today's Vitals   09/27/19 1245  BP: (!) 166/96  Temp: (!) 97.4 F (36.3 C)  TempSrc: Oral  Weight: 132 lb (59.9 kg)  Height: 5\' 3"  (1.6 m)   Body mass index is 23.38 kg/m.    Assessment and Plan: Marland KitchenJudeen was seen today for sinus problem.  Diagnoses and all orders for this visit:  Sinobronchitis -     azithromycin (ZITHROMAX) 250 MG tablet; Take 2 tablets now and then one tablet for 4 days. -     predniSONE (DELTASONE) 50 MG tablet; One tab PO daily for 5 days. -     chlorpheniramine-HYDROcodone (TUSSIONEX) 10-8 MG/5ML SUER; Take 5 mLs by mouth every 12 (twelve) hours as needed.  Essential hypertension, benign -     benazepril (LOTENSIN) 20 MG tablet; Take 1 tablet (20 mg total) by mouth daily.   No significant signs of covid. Treated for sinobronchitis with zpak/prednisone/cough syrup. Rest and hydrate. No trouble breathing or wheezing.   Increased lotensin to 20mg . Pt BP were better via mychart log but she has been coughing and not feeling well.     Follow Up Instructions:    I discussed the assessment and treatment plan with the patient. The patient was provided an opportunity to ask questions and all were answered. The patient agreed  with the plan and demonstrated an understanding of the instructions.   The patient was advised to call back or seek an in-person evaluation if the symptoms worsen or if the condition fails to improve as anticipated.    Iran Planas, PA-C

## 2019-10-10 ENCOUNTER — Encounter: Payer: Self-pay | Admitting: Physician Assistant

## 2019-10-10 DIAGNOSIS — G43009 Migraine without aura, not intractable, without status migrainosus: Secondary | ICD-10-CM

## 2019-10-10 NOTE — Telephone Encounter (Signed)
Can you find this pharmacy and ok to send emgality 120mg  to take monthly.  #3 pens no refills to pharmacy she listed in [place].

## 2019-10-11 MED ORDER — EMGALITY 120 MG/ML ~~LOC~~ SOAJ: 120.0000 mg | SUBCUTANEOUS | 0 refills | Status: DC

## 2019-10-11 MED ORDER — EMGALITY 120 MG/ML ~~LOC~~ SOAJ: 240.0000 mg | SUBCUTANEOUS | 0 refills | Status: DC

## 2019-10-11 NOTE — Telephone Encounter (Signed)
Prescription sent to [facility] at [address] [place], [place] [address]

## 2019-10-11 NOTE — Addendum Note (Signed)
Addended bySilvio Pate on: 10/11/2019 04:44 PM   Modules accepted: Orders

## 2019-11-07 ENCOUNTER — Other Ambulatory Visit: Payer: Self-pay | Admitting: Physician Assistant

## 2019-11-07 ENCOUNTER — Encounter: Payer: Self-pay | Admitting: Physician Assistant

## 2019-11-07 DIAGNOSIS — I10 Essential (primary) hypertension: Secondary | ICD-10-CM

## 2019-11-07 DIAGNOSIS — G43009 Migraine without aura, not intractable, without status migrainosus: Secondary | ICD-10-CM

## 2019-11-07 DIAGNOSIS — I700 Atherosclerosis of aorta: Secondary | ICD-10-CM

## 2019-11-08 MED ORDER — EMGALITY 120 MG/ML ~~LOC~~ SOAJ: 120.0000 mg | SUBCUTANEOUS | 0 refills | Status: DC

## 2019-11-09 MED ORDER — ATORVASTATIN CALCIUM 40 MG PO TABS: 40.0000 mg | ORAL_TABLET | Freq: Every day | ORAL | 3 refills | Status: DC

## 2019-11-09 MED ORDER — EMGALITY 120 MG/ML ~~LOC~~ SOAJ: 120.0000 mg | SUBCUTANEOUS | 3 refills | Status: DC

## 2019-11-09 MED ORDER — SUMATRIPTAN SUCCINATE 50 MG PO TABS: ORAL_TABLET | ORAL | 5 refills | Status: AC

## 2019-11-09 MED ORDER — TROKENDI XR 100 MG PO CP24: 1.0000 | ORAL_CAPSULE | Freq: Every day | ORAL | 3 refills | Status: DC

## 2019-11-09 MED ORDER — BENAZEPRIL HCL 20 MG PO TABS: 20.0000 mg | ORAL_TABLET | Freq: Every day | ORAL | 3 refills | Status: DC

## 2019-11-18 NOTE — Telephone Encounter (Signed)
I have not received a PA for this patient - CF

## 2019-11-22 NOTE — Telephone Encounter (Signed)
I sent PA for Ajovy through cover my meds waiting on determination. - CF

## 2019-11-24 ENCOUNTER — Encounter: Payer: Self-pay | Admitting: Physician Assistant

## 2019-11-24 NOTE — Telephone Encounter (Signed)
I received a fax from [facility] and they denied coverage on Ajovy due to patient must try and fail Aimovig and emgality. Placing in providers box for review. - CF

## 2019-11-28 ENCOUNTER — Encounter: Payer: Self-pay | Admitting: Physician Assistant

## 2019-11-28 DIAGNOSIS — Z0000 Encounter for general adult medical examination without abnormal findings: Secondary | ICD-10-CM

## 2019-11-28 DIAGNOSIS — Z1329 Encounter for screening for other suspected endocrine disorder: Secondary | ICD-10-CM

## 2019-11-28 DIAGNOSIS — Z13220 Encounter for screening for lipoid disorders: Secondary | ICD-10-CM

## 2019-11-28 DIAGNOSIS — I10 Essential (primary) hypertension: Secondary | ICD-10-CM

## 2019-11-28 DIAGNOSIS — Z131 Encounter for screening for diabetes mellitus: Secondary | ICD-10-CM

## 2019-11-28 DIAGNOSIS — Z130 Encounter for screening for diseases of the blood and blood-forming organs and certain disorders involving the immune mechanism: Secondary | ICD-10-CM

## 2019-11-29 ENCOUNTER — Telehealth: Payer: Self-pay | Admitting: Neurology

## 2019-11-29 NOTE — Telephone Encounter (Signed)
Mychart message sent to patient.

## 2019-11-29 NOTE — Telephone Encounter (Signed)
Ok to send labs to [facility].  Lipid Cmp Tsh Cbc Vitamin D

## 2019-11-30 NOTE — Telephone Encounter (Signed)
Labs ordered and faxed to [phone] with confirmation received.

## 2019-12-06 ENCOUNTER — Telehealth: Payer: Self-pay | Admitting: Physician Assistant

## 2019-12-06 NOTE — Telephone Encounter (Signed)
Received fax for PA on Topiramate ER sent through cover my meds waiting on determination. - CF

## 2019-12-08 ENCOUNTER — Other Ambulatory Visit: Payer: Self-pay

## 2019-12-08 DIAGNOSIS — G43009 Migraine without aura, not intractable, without status migrainosus: Secondary | ICD-10-CM

## 2019-12-08 MED ORDER — TROKENDI XR 100 MG PO CP24: 1.0000 | ORAL_CAPSULE | Freq: Every day | ORAL | 3 refills | Status: DC

## 2019-12-09 LAB — COMPREHENSIVE METABOLIC PANEL
ALT: 18 IU/L (ref 0–32)
AST: 20 IU/L (ref 0–40)
Albumin/Globulin Ratio: 2 (ref 1.2–2.2)
Albumin: 4.5 g/dL (ref 3.8–4.9)
Alkaline Phosphatase: 104 IU/L (ref 39–117)
BUN/Creatinine Ratio: 22 (ref 9–23)
BUN: 17 mg/dL (ref 6–24)
Bilirubin Total: 0.8 mg/dL (ref 0.0–1.2)
CO2: 24 mmol/L (ref 20–29)
Calcium: 9.7 mg/dL (ref 8.7–10.2)
Chloride: 106 mmol/L (ref 96–106)
Creatinine, Ser: 0.76 mg/dL (ref 0.57–1.00)
GFR calc Af Amer: 99 mL/min/{1.73_m2} (ref >59)
GFR calc non Af Amer: 86 mL/min/{1.73_m2} (ref >59)
Globulin, Total: 2.2 g/dL (ref 1.5–4.5)
Glucose: 89 mg/dL (ref 65–99)
Potassium: 4.3 mmol/L (ref 3.5–5.2)
Sodium: 145 mmol/L — ABNORMAL HIGH (ref 134–144)
Total Protein: 6.7 g/dL (ref 6.0–8.5)

## 2019-12-09 LAB — CBC WITH DIFFERENTIAL/PLATELET
Basophils Absolute: 0 10*3/uL (ref 0.0–0.2)
Basos: 0 %
EOS (ABSOLUTE): 0.1 10*3/uL (ref 0.0–0.4)
Eos: 2 %
Hematocrit: 40.2 % (ref 34.0–46.6)
Hemoglobin: 12.8 g/dL (ref 11.1–15.9)
Immature Grans (Abs): 0 10*3/uL (ref 0.0–0.1)
Immature Granulocytes: 0 %
Lymphocytes Absolute: 1.9 10*3/uL (ref 0.7–3.1)
Lymphs: 32 %
MCH: 28.3 pg (ref 26.6–33.0)
MCHC: 31.8 g/dL (ref 31.5–35.7)
MCV: 89 fL (ref 79–97)
Monocytes Absolute: 0.4 10*3/uL (ref 0.1–0.9)
Monocytes: 6 %
Neutrophils Absolute: 3.5 10*3/uL (ref 1.4–7.0)
Neutrophils: 60 %
Platelets: 277 10*3/uL (ref 150–450)
RBC: 4.52 x10E6/uL (ref 3.77–5.28)
RDW: 12.9 % (ref 11.7–15.4)
WBC: 5.9 10*3/uL (ref 3.4–10.8)

## 2019-12-09 LAB — TSH: TSH: 2.22 u[IU]/mL (ref 0.450–4.500)

## 2019-12-09 LAB — LIPID PANEL
Chol/HDL Ratio: 2.3 ratio (ref 0.0–4.4)
Cholesterol, Total: 149 mg/dL (ref 100–199)
HDL: 66 mg/dL (ref >39)
LDL Chol Calc (NIH): 69 mg/dL (ref 0–99)
Triglycerides: 73 mg/dL (ref 0–149)
VLDL Cholesterol Cal: 14 mg/dL (ref 5–40)

## 2019-12-09 LAB — VITAMIN D 25 HYDROXY (VIT D DEFICIENCY, FRACTURES): Vit D, 25-Hydroxy: 55 ng/mL (ref 30.0–100.0)

## 2019-12-09 NOTE — Telephone Encounter (Signed)
[name],  Cholesterol looks great. Thyroid looks great. You are not anemic. Vitamin D looks great.  Sodium just a hair elevated. Likely would resolve with recheck. Labs look really good.   Rebecca Berry

## 2019-12-13 NOTE — Telephone Encounter (Signed)
Received fax from [facility] and Topiramate ER is approved valid 12/06/19 - 12/04/22.   Reference # [id number] - CF

## 2019-12-15 ENCOUNTER — Telehealth (INDEPENDENT_AMBULATORY_CARE_PROVIDER_SITE_OTHER): Payer: BC Managed Care – PPO | Admitting: Physician Assistant

## 2019-12-15 VITALS — BP 152/87 | HR 74 | Temp 97.7°F | Ht 63.0 in | Wt 132.0 lb

## 2019-12-15 DIAGNOSIS — R4589 Other symptoms and signs involving emotional state: Secondary | ICD-10-CM | POA: Diagnosis not present

## 2019-12-15 DIAGNOSIS — I10 Essential (primary) hypertension: Secondary | ICD-10-CM

## 2019-12-15 DIAGNOSIS — G43009 Migraine without aura, not intractable, without status migrainosus: Secondary | ICD-10-CM

## 2019-12-15 DIAGNOSIS — Z0000 Encounter for general adult medical examination without abnormal findings: Secondary | ICD-10-CM

## 2019-12-15 MED ORDER — CITALOPRAM HYDROBROMIDE 10 MG PO TABS: 10.0000 mg | ORAL_TABLET | Freq: Every day | ORAL | 0 refills | Status: DC

## 2019-12-15 MED ORDER — BENAZEPRIL HCL 40 MG PO TABS: 40.0000 mg | ORAL_TABLET | Freq: Every day | ORAL | 0 refills | Status: DC

## 2019-12-15 NOTE — Progress Notes (Signed)
Follow up to discuss migraines, didn't switch to Ajovy yet because she has to wait a month from [facility], will start soon PHQ9-GAD7 completed.

## 2019-12-15 NOTE — Progress Notes (Signed)
Patient ID: Rebecca Berry, female   DOB: [birth year], 59 y.o.   MRN: [id number] .Marland KitchenVirtual Visit via Video Note  I connected with Rebecca Berry on 12/15/2019 at 11:10 AM EDT by a video enabled telemedicine application and verified that I am speaking with the correct person using two identifiers.  Location: Patient: camper Provider: clinic   I discussed the limitations of evaluation and management by telemedicine and the availability of in person appointments. The patient expressed understanding and agreed to proceed.  History of Present Illness: Pt is a 59 yo female with HTN, Migraines who calls into the clinic for annual visit. She would like to go over labs.   She has had more depressed mood lately. She is traveling in camper with husband and noticing she feels lonely and upsetting her that she does not have a "home". Her husband does not understand. It is supposed to be a fun adventure but she would like to go home. She denies any SI/HC. She is not on any medication.   .. Active Ambulatory Problems    Diagnosis Date Noted  . Preventive measure 11/15/2013  . Essential hypertension, benign 11/15/2013  . Hyperlipidemia 11/15/2013  . Encounter for monitoring statin therapy 11/15/2013  . Wandering pacemaker 11/15/2013  . Migraine 11/15/2013  . Unintentional weight change 11/15/2013  . Hearing loss 11/15/2013  . Fatigue 11/16/2013  . Plantar fasciitis, left 02/27/2014  . Hiatal hernia 03/10/2014  . Hepatic steatosis 03/10/2014  . Atherosclerosis of aorta (HCC) 03/12/2014  . Insomnia 05/17/2015  . Situational anxiety 03/18/2016  . History of cellulitis 12/17/2016  . PVC (premature ventricular contraction) 01/12/2017  . Hot flashes 05/21/2017  . Rotator cuff syndrome, left 11/26/2017  . Lytic lesion of bone on x-ray 11/26/2017  . Lumbar spondylosis 08/24/2018  . Depressed mood 12/17/2019   Resolved Ambulatory Problems    Diagnosis Date Noted  . No Resolved Ambulatory  Problems   Past Medical History:  Diagnosis Date  . Back pain   . Hypertension   . Migraines   . Pacemaker syndrome    Reviewed med, allergy, problem list.     Observations/Objective: No acute distress.  Normal mood and appearance.   .. Today's Vitals   12/15/19 1037  BP: (!) 152/87  Pulse: 74  Temp: 97.7 F (36.5 C)  TempSrc: Oral  Weight: 132 lb (59.9 kg)  Height: 5\' 3"  (1.6 m)   Body mass index is 23.38 kg/m.  .. Depression screen [facility] 2/9 12/15/2019 11/03/2018 10/02/2017 05/19/2017 12/17/2016  Decreased Interest 1 0 0 0 0  Down, Depressed, Hopeless 1 0 0 0 0  PHQ - 2 Score 2 0 0 0 0  Altered sleeping 1 0 - - -  Tired, decreased energy 1 0 - - -  Change in appetite 2 0 - - -  Feeling bad or failure about yourself  1 0 - - -  Trouble concentrating 1 0 - - -  Moving slowly or fidgety/restless 1 0 - - -  Suicidal thoughts 0 0 - - -  PHQ-9 Score 9 0 - - -  Difficult doing work/chores Not difficult at all Not difficult at all - - -   .12/19/2016 GAD 7 : Generalized Anxiety Score 12/15/2019 11/03/2018  Nervous, Anxious, on Edge 1 0  Control/stop worrying 1 0  Worry too much - different things 1 0  Trouble relaxing 2 0  Restless 1 0  Easily annoyed or irritable 1 0  Afraid - awful might happen 1 0  Total GAD 7 Score 8 0  Anxiety Difficulty Not difficult at all Not difficult at all       Assessment and Plan: Marland KitchenMarland KitchenGessica was seen today for migraine.  Diagnoses and all orders for this visit:  Routine physical examination  Essential hypertension, benign -     benazepril (LOTENSIN) 40 MG tablet; Take 1 tablet (40 mg total) by mouth daily.  Migraine without aura and without status migrainosus, not intractable  Depressed mood -     citalopram (CELEXA) 10 MG tablet; Take 1 tablet (10 mg total) by mouth daily.   .. Discussed 150 minutes of exercise a week.  Encouraged vitamin D 1000 units and Calcium 1300mg  or 4 servings of dairy a day.  Reviewed labs.  Colonoscopy  UTD.  Mammogram UTD.   Increased benazepril to 40mg  daily. Keep BP log. Sent readings in next month.   Started celexa. Discussed side effects. Follow up in 4-6 weeks. Discussed regular exercise and meditations. Encouraged patient to discuss plan with her husband considering her happiness goals.    Follow Up Instructions:    I discussed the assessment and treatment plan with the patient. The patient was provided an opportunity to ask questions and all were answered. The patient agreed with the plan and demonstrated an understanding of the instructions.   The patient was advised to call back or seek an in-person evaluation if the symptoms worsen or if the condition fails to improve as anticipated.  I provided  20 minutes of non-face-to-face time during this encounter.   Iran Planas, PA-C

## 2019-12-16 ENCOUNTER — Encounter: Payer: Self-pay | Admitting: Physician Assistant

## 2019-12-16 DIAGNOSIS — G43009 Migraine without aura, not intractable, without status migrainosus: Secondary | ICD-10-CM

## 2019-12-16 NOTE — Telephone Encounter (Signed)
Routing to covering provider.  °

## 2019-12-17 DIAGNOSIS — R4589 Other symptoms and signs involving emotional state: Secondary | ICD-10-CM | POA: Insufficient documentation

## 2019-12-18 ENCOUNTER — Encounter: Payer: Self-pay | Admitting: Physician Assistant

## 2019-12-18 MED ORDER — AJOVY 225 MG/1.5ML ~~LOC~~ SOAJ: 225.0000 mg | SUBCUTANEOUS | 5 refills | Status: AC

## 2020-01-25 ENCOUNTER — Encounter: Payer: Self-pay | Admitting: Physician Assistant

## 2020-01-27 MED ORDER — AMLODIPINE BESYLATE 2.5 MG PO TABS: 2.5000 mg | ORAL_TABLET | Freq: Every day | ORAL | 0 refills | Status: DC

## 2020-01-27 MED ORDER — AIMOVIG 140 MG/ML ~~LOC~~ SOAJ: 140.0000 mg | SUBCUTANEOUS | 0 refills | Status: DC

## 2020-01-27 MED ORDER — CITALOPRAM HYDROBROMIDE 20 MG PO TABS: 20.0000 mg | ORAL_TABLET | Freq: Every day | ORAL | 0 refills | Status: DC

## 2020-01-27 NOTE — Addendum Note (Signed)
Addended by: Jomarie Longs on: 01/27/2020 02:02 PM   Modules accepted: Orders

## 2020-01-31 ENCOUNTER — Telehealth: Payer: Self-pay | Admitting: Physician Assistant

## 2020-01-31 NOTE — Telephone Encounter (Signed)
Received fax for PA on Aimovig sent through cover my meds and received authorization. - CF

## 2020-03-07 ENCOUNTER — Encounter: Payer: Self-pay | Admitting: Physician Assistant

## 2020-03-07 DIAGNOSIS — Z79899 Other long term (current) drug therapy: Secondary | ICD-10-CM

## 2020-03-07 DIAGNOSIS — I10 Essential (primary) hypertension: Secondary | ICD-10-CM

## 2020-03-09 MED ORDER — AIMOVIG 140 MG/ML ~~LOC~~ SOAJ: 140.0000 mg | SUBCUTANEOUS | 0 refills | Status: DC

## 2020-03-12 MED ORDER — BENAZEPRIL HCL 40 MG PO TABS: 40.0000 mg | ORAL_TABLET | Freq: Every day | ORAL | 0 refills | Status: DC

## 2020-03-12 NOTE — Addendum Note (Signed)
Addended by: Jomarie Longs on: 03/12/2020 04:30 PM   Modules accepted: Orders

## 2020-03-16 LAB — BASIC METABOLIC PANEL
BUN: 26 — AB (ref 4–21)
Chloride: 107 (ref 99–108)
Creatinine: 0.7 (ref 0.5–1.1)
Glucose: 75
Potassium: 3.8 (ref 3.4–5.3)
Sodium: 142 (ref 137–147)

## 2020-03-16 LAB — COMPREHENSIVE METABOLIC PANEL
Calcium: 9.4 (ref 8.7–10.7)
GFR calc Af Amer: >60
GFR calc non Af Amer: >60

## 2020-04-11 NOTE — Telephone Encounter (Signed)
I don't remember seeing anything without a name, but not sure if I would pay it attention if it didn't have one.

## 2020-04-11 NOTE — Telephone Encounter (Signed)
Can you please contact them to verify so we can get the labs. Thanks.

## 2020-04-18 ENCOUNTER — Telehealth: Payer: Self-pay | Admitting: Physician Assistant

## 2020-04-18 MED ORDER — CITALOPRAM HYDROBROMIDE 20 MG PO TABS: 20.0000 mg | ORAL_TABLET | Freq: Every day | ORAL | 0 refills | Status: DC

## 2020-04-18 MED ORDER — DIAZEPAM 10 MG PO TABS
5.0000 mg | ORAL_TABLET | Freq: Two times a day (BID) | ORAL | 1 refills | Status: AC | PRN
Start: 2020-04-18 — End: ?

## 2020-04-18 NOTE — Addendum Note (Signed)
Addended by: Jomarie Longs on: 04/18/2020 03:53 PM   Modules accepted: Orders

## 2020-04-18 NOTE — Telephone Encounter (Signed)
mychart msg sent

## 2020-04-20 ENCOUNTER — Encounter: Payer: Self-pay | Admitting: Physician Assistant

## 2020-04-20 DIAGNOSIS — R928 Other abnormal and inconclusive findings on diagnostic imaging of breast: Secondary | ICD-10-CM

## 2020-04-20 DIAGNOSIS — Z1231 Encounter for screening mammogram for malignant neoplasm of breast: Secondary | ICD-10-CM

## 2020-04-23 MED ORDER — AMLODIPINE BESYLATE 2.5 MG PO TABS: 2.5000 mg | ORAL_TABLET | Freq: Every day | ORAL | 0 refills | Status: DC

## 2020-04-23 NOTE — Addendum Note (Signed)
Addended bySilvio Pate on: 04/23/2020 09:08 AM   Modules accepted: Orders

## 2020-04-23 NOTE — Telephone Encounter (Signed)
Needs 6 month follow up. Can you see if we get ordered at specific place?

## 2020-04-23 NOTE — Telephone Encounter (Signed)
RX sent

## 2020-04-24 NOTE — Telephone Encounter (Signed)
The imaging she attached said breast MR. Please advise.

## 2020-04-24 NOTE — Telephone Encounter (Signed)
I sent her msg that is what report said.

## 2020-04-24 NOTE — Telephone Encounter (Signed)
So, should I just order mammogram?

## 2020-04-24 NOTE — Telephone Encounter (Signed)
[facility] order faxed to number provided with confirmation received.

## 2020-04-24 NOTE — Telephone Encounter (Signed)
Yes ok 

## 2020-04-26 NOTE — Addendum Note (Signed)
Addended bySilvio Pate on: 04/26/2020 08:12 AM   Modules accepted: Orders

## 2020-06-13 ENCOUNTER — Telehealth: Payer: Self-pay | Admitting: Neurology

## 2020-06-13 MED ORDER — AIMOVIG 140 MG/ML ~~LOC~~ SOAJ: 140.0000 mg | SUBCUTANEOUS | 0 refills | Status: DC

## 2020-06-13 NOTE — Telephone Encounter (Signed)
Patient requested Aimovig be sent to [facility] in [place], [place]. RX sent.

## 2020-06-14 ENCOUNTER — Other Ambulatory Visit: Payer: Self-pay | Admitting: Neurology

## 2020-06-14 DIAGNOSIS — I10 Essential (primary) hypertension: Secondary | ICD-10-CM

## 2020-06-14 MED ORDER — BENAZEPRIL HCL 40 MG PO TABS: 40.0000 mg | ORAL_TABLET | Freq: Every day | ORAL | 0 refills | Status: DC

## 2020-06-20 ENCOUNTER — Telehealth: Payer: Self-pay

## 2020-06-20 NOTE — Telephone Encounter (Signed)
Aimovig PA approved. Faxed to pharmacy.

## 2020-06-22 ENCOUNTER — Encounter: Payer: Self-pay | Admitting: Physician Assistant

## 2020-06-22 NOTE — Telephone Encounter (Signed)
Can you check into this? 

## 2020-07-18 ENCOUNTER — Encounter: Payer: Self-pay | Admitting: Physician Assistant

## 2020-07-18 MED ORDER — AMLODIPINE BESYLATE 2.5 MG PO TABS
2.5000 mg | ORAL_TABLET | Freq: Every day | ORAL | 0 refills | Status: DC
Start: 2020-07-18 — End: 2020-10-31

## 2020-08-07 ENCOUNTER — Other Ambulatory Visit: Payer: Self-pay | Admitting: Physician Assistant

## 2020-08-07 ENCOUNTER — Encounter: Payer: Self-pay | Admitting: Physician Assistant

## 2020-08-07 MED ORDER — CITALOPRAM HYDROBROMIDE 20 MG PO TABS: 20.0000 mg | ORAL_TABLET | Freq: Every day | ORAL | 0 refills | Status: DC

## 2020-09-13 ENCOUNTER — Encounter: Payer: Self-pay | Admitting: Physician Assistant

## 2020-09-13 DIAGNOSIS — I10 Essential (primary) hypertension: Secondary | ICD-10-CM

## 2020-09-13 MED ORDER — BENAZEPRIL HCL 40 MG PO TABS: 40.0000 mg | ORAL_TABLET | Freq: Every day | ORAL | 0 refills | Status: DC

## 2020-10-11 ENCOUNTER — Encounter: Payer: Self-pay | Admitting: Physician Assistant

## 2020-10-12 MED ORDER — AIMOVIG 140 MG/ML ~~LOC~~ SOAJ
140.0000 mg | SUBCUTANEOUS | 0 refills | Status: DC
Start: 2020-10-12 — End: 2020-12-26

## 2020-10-31 ENCOUNTER — Encounter: Payer: Self-pay | Admitting: Physician Assistant

## 2020-10-31 MED ORDER — AMLODIPINE BESYLATE 2.5 MG PO TABS: 2.5000 mg | ORAL_TABLET | Freq: Every day | ORAL | 0 refills | Status: DC

## 2020-12-03 ENCOUNTER — Encounter: Payer: Self-pay | Admitting: Physician Assistant

## 2020-12-03 DIAGNOSIS — Z13220 Encounter for screening for lipoid disorders: Secondary | ICD-10-CM

## 2020-12-03 DIAGNOSIS — Z1329 Encounter for screening for other suspected endocrine disorder: Secondary | ICD-10-CM

## 2020-12-03 DIAGNOSIS — I10 Essential (primary) hypertension: Secondary | ICD-10-CM

## 2020-12-03 DIAGNOSIS — Z130 Encounter for screening for diseases of the blood and blood-forming organs and certain disorders involving the immune mechanism: Secondary | ICD-10-CM

## 2020-12-03 MED ORDER — CITALOPRAM HYDROBROMIDE 20 MG PO TABS: 20.0000 mg | ORAL_TABLET | Freq: Every day | ORAL | 0 refills | Status: DC

## 2020-12-14 NOTE — Telephone Encounter (Signed)
Orders signed.

## 2020-12-18 ENCOUNTER — Encounter: Payer: Self-pay | Admitting: Physician Assistant

## 2020-12-18 DIAGNOSIS — I10 Essential (primary) hypertension: Secondary | ICD-10-CM

## 2020-12-18 DIAGNOSIS — I700 Atherosclerosis of aorta: Secondary | ICD-10-CM

## 2020-12-18 LAB — COMPLETE METABOLIC PANEL WITH GFR
AG Ratio: 2 (calc) (ref 1.0–2.5)
ALT: 15 U/L (ref 6–29)
AST: 16 U/L (ref 10–35)
Albumin: 4.3 g/dL (ref 3.6–5.1)
Alkaline phosphatase (APISO): 94 U/L (ref 37–153)
BUN: 21 mg/dL (ref 7–25)
CO2: 27 mmol/L (ref 20–32)
Calcium: 9.2 mg/dL (ref 8.6–10.4)
Chloride: 111 mmol/L — ABNORMAL HIGH (ref 98–110)
Creat: 0.71 mg/dL (ref 0.50–0.99)
GFR, Est African American: 107 mL/min/{1.73_m2} (ref >=60)
GFR, Est Non African American: 93 mL/min/{1.73_m2} (ref >=60)
Globulin: 2.1 g/dL (calc) (ref 1.9–3.7)
Glucose, Bld: 90 mg/dL (ref 65–99)
Potassium: 4.1 mmol/L (ref 3.5–5.3)
Sodium: 144 mmol/L (ref 135–146)
Total Bilirubin: 0.9 mg/dL (ref 0.2–1.2)
Total Protein: 6.4 g/dL (ref 6.1–8.1)

## 2020-12-18 LAB — CBC WITH DIFFERENTIAL/PLATELET
Absolute Monocytes: 291 cells/uL (ref 200–950)
Basophils Absolute: 10 cells/uL (ref 0–200)
Basophils Relative: 0.2 %
Eosinophils Absolute: 71 cells/uL (ref 15–500)
Eosinophils Relative: 1.4 %
HCT: 38.8 % (ref 35.0–45.0)
Hemoglobin: 12.5 g/dL (ref 11.7–15.5)
Lymphs Abs: 1377 cells/uL (ref 850–3900)
MCH: 28.3 pg (ref 27.0–33.0)
MCHC: 32.2 g/dL (ref 32.0–36.0)
MCV: 88 fL (ref 80.0–100.0)
MPV: 11.6 fL (ref 7.5–12.5)
Monocytes Relative: 5.7 %
Neutro Abs: 3351 cells/uL (ref 1500–7800)
Neutrophils Relative %: 65.7 %
Platelets: 272 10*3/uL (ref 140–400)
RBC: 4.41 10*6/uL (ref 3.80–5.10)
RDW: 13 % (ref 11.0–15.0)
Total Lymphocyte: 27 %
WBC: 5.1 10*3/uL (ref 3.8–10.8)

## 2020-12-18 LAB — LIPID PANEL W/REFLEX DIRECT LDL
Cholesterol: 134 mg/dL (ref <200)
HDL: 63 mg/dL (ref >=50)
LDL Cholesterol (Calc): 56 mg/dL (calc)
Non-HDL Cholesterol (Calc): 71 mg/dL (calc) (ref <130)
Total CHOL/HDL Ratio: 2.1 (calc) (ref <5.0)
Triglycerides: 71 mg/dL (ref <150)

## 2020-12-18 LAB — VITAMIN D 25 HYDROXY (VIT D DEFICIENCY, FRACTURES): Vit D, 25-Hydroxy: 45 ng/mL (ref 30–100)

## 2020-12-18 LAB — TSH: TSH: 1.14 mIU/L (ref 0.40–4.50)

## 2020-12-18 MED ORDER — ATORVASTATIN CALCIUM 40 MG PO TABS: 40.0000 mg | ORAL_TABLET | Freq: Every day | ORAL | 3 refills | Status: DC

## 2020-12-18 MED ORDER — BENAZEPRIL HCL 40 MG PO TABS: 40.0000 mg | ORAL_TABLET | Freq: Every day | ORAL | 1 refills | Status: DC

## 2020-12-19 ENCOUNTER — Other Ambulatory Visit: Payer: Self-pay | Admitting: Physician Assistant

## 2020-12-19 DIAGNOSIS — I10 Essential (primary) hypertension: Secondary | ICD-10-CM

## 2020-12-19 DIAGNOSIS — I700 Atherosclerosis of aorta: Secondary | ICD-10-CM

## 2020-12-25 ENCOUNTER — Encounter: Payer: BC Managed Care – PPO | Admitting: Physician Assistant

## 2020-12-26 ENCOUNTER — Other Ambulatory Visit: Payer: Self-pay

## 2020-12-26 ENCOUNTER — Ambulatory Visit (INDEPENDENT_AMBULATORY_CARE_PROVIDER_SITE_OTHER): Payer: BLUE CROSS/BLUE SHIELD | Admitting: Physician Assistant

## 2020-12-26 VITALS — BP 146/79 | HR 86 | Ht 63.0 in | Wt 136.0 lb

## 2020-12-26 DIAGNOSIS — I700 Atherosclerosis of aorta: Secondary | ICD-10-CM

## 2020-12-26 DIAGNOSIS — Z87891 Personal history of nicotine dependence: Secondary | ICD-10-CM | POA: Diagnosis not present

## 2020-12-26 DIAGNOSIS — Z0000 Encounter for general adult medical examination without abnormal findings: Secondary | ICD-10-CM | POA: Diagnosis not present

## 2020-12-26 DIAGNOSIS — G43009 Migraine without aura, not intractable, without status migrainosus: Secondary | ICD-10-CM | POA: Diagnosis not present

## 2020-12-26 DIAGNOSIS — R4589 Other symptoms and signs involving emotional state: Secondary | ICD-10-CM

## 2020-12-26 MED ORDER — CITALOPRAM HYDROBROMIDE 20 MG PO TABS: 20.0000 mg | ORAL_TABLET | Freq: Every day | ORAL | 3 refills | Status: AC

## 2020-12-26 MED ORDER — TROKENDI XR 200 MG PO CP24: 1.0000 | ORAL_CAPSULE | Freq: Every day | ORAL | 3 refills | Status: DC

## 2020-12-26 MED ORDER — ATORVASTATIN CALCIUM 40 MG PO TABS: 40.0000 mg | ORAL_TABLET | Freq: Every day | ORAL | 3 refills | Status: AC

## 2020-12-26 NOTE — Progress Notes (Signed)
Subjective:     Rebecca Berry is a 60 y.o. female and is here for a comprehensive physical exam. The patient reports no problems.  Migraines increasing in frequency since stopping aimovig. Insurance will not pay for it. On trokendi.   Social History   Socioeconomic History  . Marital status: Married    Spouse name: Not on file  . Number of children: Not on file  . Years of education: Not on file  . Highest education level: Not on file  Occupational History  . Not on file  Tobacco Use  . Smoking status: Former Smoker    Quit date: 03/26/2012    Years since quitting: 8.7  . Smokeless tobacco: Never Used  Substance and Sexual Activity  . Alcohol use: No  . Drug use: No  . Sexual activity: Yes    Partners: Male  Other Topics Concern  . Not on file  Social History Narrative  . Not on file   Social Determinants of Health   Financial Resource Strain: Not on file  Food Insecurity: Not on file  Transportation Needs: Not on file  Physical Activity: Not on file  Stress: Not on file  Social Connections: Not on file  Intimate Partner Violence: Not on file   Health Maintenance  Topic Date Due  . Hepatitis C Screening  Never done  . HIV Screening  12/26/2021 (Originally 02/24/1976)  . INFLUENZA VACCINE  03/25/2021  . COLONOSCOPY (Pts 45-82yrs Insurance coverage will need to be confirmed)  03/08/2022  . MAMMOGRAM  04/26/2022  . PAP SMEAR-Modifier  10/12/2022  . TETANUS/TDAP  05/26/2023  . COVID-19 Vaccine  Completed  . HPV VACCINES  Aged Out    The following portions of the patient's history were reviewed and updated as appropriate: allergies, current medications, past family history, past medical history, past social history, past surgical history and problem list.  Review of Systems Pertinent items noted in HPI and remainder of comprehensive ROS otherwise negative.   Objective:    BP (!) 146/79   Pulse 86   Ht 5\' 3"  (1.6 m)   Wt 136 lb (61.7 kg)   SpO2 95%   BMI  24.09 kg/m  General appearance: alert, cooperative and appears stated age Head: Normocephalic, without obvious abnormality, atraumatic Eyes: conjunctivae/corneas clear. PERRL, EOM's intact. Fundi benign. Ears: normal TM's and external ear canals both ears Nose: Nares normal. Septum midline. Mucosa normal. No drainage or sinus tenderness. Throat: lips, mucosa, and tongue normal; teeth and gums normal Neck: no adenopathy, no carotid bruit, no JVD, supple, symmetrical, trachea midline and thyroid not enlarged, symmetric, no tenderness/mass/nodules Back: symmetric, no curvature. ROM normal. No CVA tenderness. Lungs: clear to auscultation bilaterally Heart: regular rate and rhythm, S1, S2 normal, no murmur, click, rub or gallop Abdomen: soft, non-tender; bowel sounds normal; no masses,  no organomegaly Extremities: extremities normal, atraumatic, no cyanosis or edema Pulses: 2+ and symmetric Skin: Skin color, texture, turgor normal. No rashes or lesions Lymph nodes: Cervical, supraclavicular, and axillary nodes normal. Neurologic: Alert and oriented X 3, normal strength and tone. Normal symmetric reflexes. Normal coordination and gait    . Depression screen [facility] 2/9 12/26/2020 12/15/2019 11/03/2018 10/02/2017 05/19/2017  Decreased Interest 0 1 0 0 0  Down, Depressed, Hopeless 0 1 0 0 0  PHQ - 2 Score 0 2 0 0 0  Altered sleeping - 1 0 - -  Tired, decreased energy - 1 0 - -  Change in appetite - 2 0 - -  Feeling bad or failure about yourself  - 1 0 - -  Trouble concentrating - 1 0 - -  Moving slowly or fidgety/restless - 1 0 - -  Suicidal thoughts - 0 0 - -  PHQ-9 Score - 9 0 - -  Difficult doing work/chores - Not difficult at all Not difficult at all - -   .[name] GAD 7 : Generalized Anxiety Score 12/15/2019 11/03/2018  Nervous, Anxious, on Edge 1 0  Control/stop worrying 1 0  Worry too much - different things 1 0  Trouble relaxing 2 0  Restless 1 0  Easily annoyed or irritable 1 0  Afraid -  awful might happen 1 0  Total GAD 7 Score 8 0  Anxiety Difficulty Not difficult at all Not difficult at all     Assessment:    Healthy female exam.      Plan:    Marland KitchenMarland KitchenMyisha was seen today for annual exam.  Diagnoses and all orders for this visit:  Routine physical examination -     Ambulatory Referral for Lung Cancer Scre  Migraine without aura and without status migrainosus, not intractable -     Topiramate ER (TROKENDI XR) 200 MG CP24; Take 1 tablet by mouth daily.  Aortic atherosclerosis (HCC) -     atorvastatin (LIPITOR) 40 MG tablet; Take 1 tablet (40 mg total) by mouth daily.  Former smoker -     Ambulatory Referral for Lung Cancer Scre  Depressed mood -     citalopram (CELEXA) 20 MG tablet; Take 1 tablet (20 mg total) by mouth daily.  .. Discussed 150 minutes of exercise a week.  Encouraged vitamin D 1000 units and Calcium 1300mg  or 4 servings of dairy a day.  PHQ-9 doing great on celexa. Refilled.  Mammogram UTD.  Pap UTD.  Colonoscopy UTD.  Fasting labs done. Reviewed today.  Flu/pneumonia/covid/Tdap/shingles UTD.   Refilled statin. Aortic atherosclerosis noted.   Former smoker quit 8 years ago. Screen for low dose CT scan for lung cancer. Smoked for greater than 30 pack year hx.   BP to goal under 150/90. She reports better readings at home. Continue amlodipine, benazepril.   Increased trokendi to 200mg  daily. aimovig worked not wanting to pay.   6 months blood pressure follow up.    See After Visit Summary for Counseling Recommendations

## 2020-12-26 NOTE — Patient Instructions (Signed)
Health Maintenance, Female Adopting a healthy lifestyle and getting preventive care are important in promoting health and wellness. Ask your health care provider about:  The right schedule for you to have regular tests and exams.  Things you can do on your own to prevent diseases and keep yourself healthy. What should I know about diet, weight, and exercise? Eat a healthy diet  Eat a diet that includes plenty of vegetables, fruits, low-fat dairy products, and lean protein.  Do not eat a lot of foods that are high in solid fats, added sugars, or sodium.   Maintain a healthy weight Body mass index (BMI) is used to identify weight problems. It estimates body fat based on height and weight. Your health care provider can help determine your BMI and help you achieve or maintain a healthy weight. Get regular exercise Get regular exercise. This is one of the most important things you can do for your health. Most adults should:  Exercise for at least 150 minutes each week. The exercise should increase your heart rate and make you sweat (moderate-intensity exercise).  Do strengthening exercises at least twice a week. This is in addition to the moderate-intensity exercise.  Spend less time sitting. Even light physical activity can be beneficial. Watch cholesterol and blood lipids Have your blood tested for lipids and cholesterol at 61 years of age, then have this test every 5 years. Have your cholesterol levels checked more often if:  Your lipid or cholesterol levels are high.  You are older than 61 years of age.  You are at high risk for heart disease. What should I know about cancer screening? Depending on your health history and family history, you may need to have cancer screening at various ages. This may include screening for:  Breast cancer.  Cervical cancer.  Colorectal cancer.  Skin cancer.  Lung cancer. What should I know about heart disease, diabetes, and high blood  pressure? Blood pressure and heart disease  High blood pressure causes heart disease and increases the risk of stroke. This is more likely to develop in people who have high blood pressure readings, are of African descent, or are overweight.  Have your blood pressure checked: ? Every 3-5 years if you are 18-39 years of age. ? Every year if you are 40 years old or older. Diabetes Have regular diabetes screenings. This checks your fasting blood sugar level. Have the screening done:  Once every three years after age 40 if you are at a normal weight and have a low risk for diabetes.  More often and at a younger age if you are overweight or have a high risk for diabetes. What should I know about preventing infection? Hepatitis B If you have a higher risk for hepatitis B, you should be screened for this virus. Talk with your health care provider to find out if you are at risk for hepatitis B infection. Hepatitis C Testing is recommended for:  Everyone born from 1945 through 1965.  Anyone with known risk factors for hepatitis C. Sexually transmitted infections (STIs)  Get screened for STIs, including gonorrhea and chlamydia, if: ? You are sexually active and are younger than 61 years of age. ? You are older than 61 years of age and your health care provider tells you that you are at risk for this type of infection. ? Your sexual activity has changed since you were last screened, and you are at increased risk for chlamydia or gonorrhea. Ask your health care provider   if you are at risk.  Ask your health care provider about whether you are at high risk for HIV. Your health care provider may recommend a prescription medicine to help prevent HIV infection. If you choose to take medicine to prevent HIV, you should first get tested for HIV. You should then be tested every 3 months for as long as you are taking the medicine. Pregnancy  If you are about to stop having your period (premenopausal) and  you may become pregnant, seek counseling before you get pregnant.  Take 400 to 800 micrograms (mcg) of folic acid every day if you become pregnant.  Ask for birth control (contraception) if you want to prevent pregnancy. Osteoporosis and menopause Osteoporosis is a disease in which the bones lose minerals and strength with aging. This can result in bone fractures. If you are 65 years old or older, or if you are at risk for osteoporosis and fractures, ask your health care provider if you should:  Be screened for bone loss.  Take a calcium or vitamin D supplement to lower your risk of fractures.  Be given hormone replacement therapy (HRT) to treat symptoms of menopause. Follow these instructions at home: Lifestyle  Do not use any products that contain nicotine or tobacco, such as cigarettes, e-cigarettes, and chewing tobacco. If you need help quitting, ask your health care provider.  Do not use street drugs.  Do not share needles.  Ask your health care provider for help if you need support or information about quitting drugs. Alcohol use  Do not drink alcohol if: ? Your health care provider tells you not to drink. ? You are pregnant, may be pregnant, or are planning to become pregnant.  If you drink alcohol: ? Limit how much you use to 0-1 drink a day. ? Limit intake if you are breastfeeding.  Be aware of how much alcohol is in your drink. In the [place] one drink equals one 12 oz bottle of beer (355 mL), one 5 oz glass of wine (148 mL), or one 1 oz glass of hard liquor (44 mL). General instructions  Schedule regular health, dental, and eye exams.  Stay current with your vaccines.  Tell your health care provider if: ? You often feel depressed. ? You have ever been abused or do not feel safe at home. Summary  Adopting a healthy lifestyle and getting preventive care are important in promoting health and wellness.  Follow your health care provider's instructions about healthy  diet, exercising, and getting tested or screened for diseases.  Follow your health care provider's instructions on monitoring your cholesterol and blood pressure. This information is not intended to replace advice given to you by your health care provider. Make sure you discuss any questions you have with your health care provider. Document Revised: 08/04/2018 Document Reviewed: 08/04/2018 Elsevier Patient Education  2021 Elsevier Inc.  

## 2020-12-31 ENCOUNTER — Encounter: Payer: Self-pay | Admitting: Physician Assistant

## 2020-12-31 DIAGNOSIS — I700 Atherosclerosis of aorta: Secondary | ICD-10-CM | POA: Insufficient documentation

## 2021-01-25 ENCOUNTER — Other Ambulatory Visit: Payer: Self-pay | Admitting: Physician Assistant

## 2021-02-26 ENCOUNTER — Other Ambulatory Visit: Payer: Self-pay | Admitting: Physician Assistant

## 2021-02-26 DIAGNOSIS — G43009 Migraine without aura, not intractable, without status migrainosus: Secondary | ICD-10-CM

## 2021-03-01 ENCOUNTER — Other Ambulatory Visit: Payer: Self-pay | Admitting: Physician Assistant

## 2021-03-01 DIAGNOSIS — G43009 Migraine without aura, not intractable, without status migrainosus: Secondary | ICD-10-CM

## 2021-03-06 ENCOUNTER — Telehealth: Payer: Self-pay

## 2021-03-06 NOTE — Telephone Encounter (Signed)
Fax received from pharmacy indicating a PA was needed for Aimovig 140mg /ml auto-injectors. Using covermymeds, a PA was completed and submitted; awaiting response.

## 2021-03-06 NOTE — Telephone Encounter (Signed)
Fax received from pharmacy indicating a PA is needed for Trokendi XR 200mg . Usinf Covermymeds, a PA was completed and submitted; awaiting response.

## 2021-03-11 NOTE — Telephone Encounter (Signed)
Fax received from [place] regarding authorization of Aimovig. Form completed and returned via fax.

## 2021-03-13 NOTE — Telephone Encounter (Signed)
Your information has been submitted and will be reviewed by [employer]. You may close this dialog, return to your dashboard, and perform other tasks. An electronic determination will be received in CoverMyMeds within 72-120 hours. You can see the latest determination by locating this request on your dashboard or by reopening this request. You will receive a fax copy of the determination. If [employer] has not responded in 120 hours, contact [employer] at [phone]. 

## 2021-03-13 NOTE — Telephone Encounter (Signed)
Questions received via Covermymeds.   Your information has been submitted and will be reviewed by [place]. You may close this dialog, return to your dashboard, and perform other tasks. An electronic determination will be received in CoverMyMeds within 72-120 hours. You can see the latest determination by locating this request on your dashboard or by reopening this request. You will receive a fax copy of the determination. If Rosann Auerbach has not responded in 120 hours, contact [employer] at [phone].

## 2021-03-14 ENCOUNTER — Encounter: Payer: Self-pay | Admitting: Physician Assistant

## 2021-03-15 ENCOUNTER — Other Ambulatory Visit: Payer: Self-pay | Admitting: Neurology

## 2021-03-15 MED ORDER — AIMOVIG 140 MG/ML ~~LOC~~ SOAJ: 140.0000 mg | SUBCUTANEOUS | 0 refills | Status: AC

## 2021-03-15 NOTE — Telephone Encounter (Signed)
Are we working the PAs?

## 2021-03-18 ENCOUNTER — Telehealth: Payer: Self-pay

## 2021-03-18 NOTE — Telephone Encounter (Signed)
PA submitted via CoverMyMeds  Instructed to call [employer] at [phone] Will fax form for completion

## 2021-03-27 NOTE — Telephone Encounter (Signed)
Fax received from [facility] stating Aimovig autoinjector 140mg /mL has been approved from 03/20/2021-03/26/2022. Pt aware via MyChart message.

## 2021-03-28 ENCOUNTER — Telehealth: Payer: Self-pay

## 2021-03-28 DIAGNOSIS — J4520 Mild intermittent asthma, uncomplicated: Secondary | ICD-10-CM

## 2021-03-28 NOTE — Telephone Encounter (Signed)
Medication: Trokendi XR 200 mg capsules Prior authorization submitted via CoverMyMeds PA submission pending

## 2021-04-01 DIAGNOSIS — J45909 Unspecified asthma, uncomplicated: Secondary | ICD-10-CM

## 2021-04-01 HISTORY — DX: Unspecified asthma, uncomplicated: J45.909

## 2021-04-02 NOTE — Telephone Encounter (Signed)
Medication: Trokendi XR 200 mg Prior authorization submitted via CoverMyMeds on 03/28/2021 PA submission pending  Still pending as of 04/02/2021

## 2021-04-03 NOTE — Telephone Encounter (Signed)
Checked CoverMyMeds and a decision had not been made so I called [employer]. They said that a determination was just made this morning and it was denied. They will fax over the denial notification that will have the reason for denial over. I was told that it was because they did not see enough evidence that the pt tried and failed topiramate. I gave the dates of the pt's history with topiramate and was told that office notes would need to be faxed to [phone]. Will wait for denial documentation to come and then send info for an appeal.    My chart message sent to pt stating: Hi Deana,   I FINALLY heard back from the insurance company. The have denied the Trokendi XR but I am going to be filing an appeal for this. Hopefully they will then approve the Trokendi. I will make sure that Lesly Rubenstein is aware of this situation and see if there is anything we can do in the meantime.   Thanks, Baxter Hire, LPN, CMA

## 2021-04-03 NOTE — Telephone Encounter (Signed)
Checked CoverMyMeds and a decision had not been made so I called [employer]. They said that a determination was just made this morning and it was denied. They will fax over the denial notification that will have the reason for denial over. I was told that it was because they did not see enough evidence that the pt tried and failed topiramate. I gave the dates of the pt's history with topiramate and was told that office notes would need to be faxed to [phone]. Will wait for denial documentation to come and then send info for an appeal.

## 2021-04-04 MED ORDER — TOPIRAMATE 100 MG PO TABS: 100.0000 mg | ORAL_TABLET | Freq: Two times a day (BID) | ORAL | 1 refills | Status: DC

## 2021-04-18 ENCOUNTER — Other Ambulatory Visit: Payer: Self-pay | Admitting: Physician Assistant

## 2021-04-18 DIAGNOSIS — R928 Other abnormal and inconclusive findings on diagnostic imaging of breast: Secondary | ICD-10-CM

## 2021-04-22 ENCOUNTER — Other Ambulatory Visit: Payer: Self-pay | Admitting: Physician Assistant

## 2021-04-22 DIAGNOSIS — R928 Other abnormal and inconclusive findings on diagnostic imaging of breast: Secondary | ICD-10-CM

## 2021-05-10 ENCOUNTER — Telehealth: Payer: Self-pay | Admitting: General Practice

## 2021-05-10 NOTE — Telephone Encounter (Signed)
Transition Care Management Unsuccessful Follow-up Telephone Call  Date of discharge and from where:  05/09/21 from [facility]  Attempts:  1st Attempt  Reason for unsuccessful TCM follow-up call:  Left voice message    

## 2021-05-13 NOTE — Telephone Encounter (Signed)
Transition Care Management Unsuccessful Follow-up Telephone Call  Date of discharge and from where:  05/09/21 from [facility]  Attempts:  2nd Attempt  Reason for unsuccessful TCM follow-up call:  Left voice message

## 2021-05-15 NOTE — Telephone Encounter (Signed)
Transition Care Management Unsuccessful Follow-up Telephone Call  Date of discharge and from where:  05/09/21 from [facility]  Attempts:  3rd Attempt  Reason for unsuccessful TCM follow-up call:  Left voice message    

## 2021-06-03 ENCOUNTER — Other Ambulatory Visit: Payer: Self-pay | Admitting: Physician Assistant

## 2021-06-03 DIAGNOSIS — I10 Essential (primary) hypertension: Secondary | ICD-10-CM

## 2021-08-28 ENCOUNTER — Other Ambulatory Visit: Payer: Self-pay | Admitting: Physician Assistant

## 2021-08-28 DIAGNOSIS — I10 Essential (primary) hypertension: Secondary | ICD-10-CM

## 2021-09-28 ENCOUNTER — Other Ambulatory Visit: Payer: Self-pay | Admitting: Physician Assistant

## 2021-09-30 NOTE — Telephone Encounter (Signed)
Trokendi denied and patient currently on Topamax it appears after review of chart. Topamax sent to pharmacy.

## 2021-10-10 ENCOUNTER — Telehealth: Payer: Self-pay

## 2021-10-10 ENCOUNTER — Encounter: Payer: Self-pay | Admitting: Physician Assistant

## 2021-10-10 NOTE — Telephone Encounter (Signed)
Initiated Prior authorization SWF:[id number] 140MG /ML auto-injectors  Via: Covermymeds Case/Key:[id number] Status: approved as of 10/10/21 Reason:approved till  03/26/2022. Notified Pt via: Mychart

## 2021-10-11 ENCOUNTER — Other Ambulatory Visit: Payer: Self-pay

## 2021-10-11 DIAGNOSIS — I10 Essential (primary) hypertension: Secondary | ICD-10-CM

## 2021-10-11 MED ORDER — BENAZEPRIL HCL 40 MG PO TABS: 40.0000 mg | ORAL_TABLET | Freq: Every day | ORAL | 0 refills | Status: DC

## 2021-11-17 ENCOUNTER — Encounter: Payer: Self-pay | Admitting: Physician Assistant

## 2021-11-17 DIAGNOSIS — I10 Essential (primary) hypertension: Secondary | ICD-10-CM

## 2021-11-18 MED ORDER — BENAZEPRIL HCL 40 MG PO TABS: 40.0000 mg | ORAL_TABLET | Freq: Every day | ORAL | 0 refills | Status: AC

## 2022-06-05 ENCOUNTER — Other Ambulatory Visit: Payer: Self-pay | Admitting: Physician Assistant

## 2022-06-14 ENCOUNTER — Other Ambulatory Visit: Payer: Self-pay | Admitting: Physician Assistant

## 2022-06-16 ENCOUNTER — Other Ambulatory Visit: Payer: Self-pay | Admitting: Physician Assistant

## 2022-07-01 ENCOUNTER — Other Ambulatory Visit: Payer: Self-pay | Admitting: Physician Assistant

## 2022-07-04 ENCOUNTER — Telehealth: Payer: Self-pay | Admitting: General Practice

## 2022-07-04 NOTE — Telephone Encounter (Signed)
Transition Care Management Unsuccessful Follow-up Telephone Call  Date of discharge and from where:  07/02/22 from [facility]  Attempts:  1st Attempt  Reason for unsuccessful TCM follow-up call:  No answer/busy

## 2022-07-07 NOTE — Telephone Encounter (Signed)
Transition Care Management Unsuccessful Follow-up Telephone Call  Date of discharge and from where:  07/02/22 from [facility]  Attempts:  2nd Attempt  Reason for unsuccessful TCM follow-up call:  No answer/busy

## 2022-07-09 NOTE — Telephone Encounter (Signed)
Transition Care Management Unsuccessful Follow-up Telephone Call  Date of discharge and from where:  07/02/22 from [facility]  Attempts:  3rd Attempt  Reason for unsuccessful TCM follow-up call:  No answer/busy

## 2022-11-14 ENCOUNTER — Telehealth: Payer: Self-pay

## 2022-11-14 NOTE — Transitions of Care (Post Inpatient/ED Visit) (Signed)
   11/14/2022  Name: Rebecca Berry MRN: [id number] DOB: [birth year]  Today's TOC FU Call Status: Today's TOC FU Call Status:: Successful TOC FU Call Competed TOC FU Call Complete Date: 11/14/22  Transition Care Management Follow-up Telephone Call Date of Discharge: 11/13/22 Discharge Facility: Other ([place]) Name of Other ([facility]) Discharge Facility: [facility] Type of Discharge: Inpatient Admission Primary Inpatient Discharge Diagnosis:: rotator cuff repair How have you been since you were released from the hospital?: Better Any questions or concerns?: No  Items Reviewed: Did you receive and understand the discharge instructions provided?: Yes Medications obtained and verified?: Yes (Medications Reviewed) Any new allergies since your discharge?: No Dietary orders reviewed?: Yes Do you have support at home?: Yes People in Home: spouse  Home Care and Equipment/Supplies: [place] Ordered?: NA Any new equipment or medical supplies ordered?: NA  Functional Questionnaire: Do you need assistance with bathing/showering or dressing?: Yes Do you need assistance with meal preparation?: Yes Do you need assistance with eating?: No Do you have difficulty maintaining continence: No Do you need assistance with getting out of bed/getting out of a chair/moving?: Yes Do you have difficulty managing or taking your medications?: No  Follow up appointments reviewed: PCP Follow-up appointment confirmed?: No (patient has transferred to [facility]) MD Provider Line Number:[phone] Given: No [facility] Follow-up appointment confirmed?: No Reason Specialist Follow-Up Not Confirmed: Patient has Specialist Provider Number and will Call for Appointment Do you need transportation to your follow-up appointment?: No Do you understand care options if your condition(s) worsen?: Yes-patient verbalized understanding    [place], [place] Direct Dial [phone]
